# Patient Record
Sex: Female | Born: 1970 | Hispanic: Refuse to answer | Marital: Single | Smoking: Never smoker
Health system: Southern US, Community
[De-identification: ages and names within clinical notes are randomized; demographics above are authoritative.]

## PROBLEM LIST (undated history)

## (undated) DIAGNOSIS — E039 Hypothyroidism, unspecified: Secondary | ICD-10-CM

## (undated) DIAGNOSIS — E079 Disorder of thyroid, unspecified: Secondary | ICD-10-CM

## (undated) DIAGNOSIS — J45909 Unspecified asthma, uncomplicated: Secondary | ICD-10-CM

## (undated) DIAGNOSIS — F419 Anxiety disorder, unspecified: Secondary | ICD-10-CM

## (undated) DIAGNOSIS — R519 Headache, unspecified: Secondary | ICD-10-CM

## (undated) DIAGNOSIS — J189 Pneumonia, unspecified organism: Secondary | ICD-10-CM

## (undated) DIAGNOSIS — M1990 Unspecified osteoarthritis, unspecified site: Secondary | ICD-10-CM

## (undated) HISTORY — PX: STRABISMUS SURGERY: SHX218

## (undated) HISTORY — PX: TUBAL LIGATION: SHX77

## (undated) HISTORY — PX: TOE AMPUTATION: SHX809

---
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
## 1996-12-03 DIAGNOSIS — E063 Autoimmune thyroiditis: Secondary | ICD-10-CM

## 1996-12-03 HISTORY — DX: Autoimmune thyroiditis: E06.3

## 2018-03-17 ENCOUNTER — Other Ambulatory Visit: Payer: Self-pay

## 2018-03-17 ENCOUNTER — Encounter (HOSPITAL_COMMUNITY): Payer: Self-pay | Admitting: Emergency Medicine

## 2018-03-17 ENCOUNTER — Emergency Department (HOSPITAL_COMMUNITY)
Admission: EM | Admit: 2018-03-17 | Discharge: 2018-03-17 | Disposition: A | Discharge status: Home / Self Care | Payer: Medicare Other | Attending: Emergency Medicine | Admitting: Emergency Medicine

## 2018-03-17 ENCOUNTER — Emergency Department (HOSPITAL_COMMUNITY): Payer: Medicare Other

## 2018-03-17 DIAGNOSIS — E079 Disorder of thyroid, unspecified: Secondary | ICD-10-CM | POA: Diagnosis not present

## 2018-03-17 DIAGNOSIS — Y999 Unspecified external cause status: Secondary | ICD-10-CM | POA: Insufficient documentation

## 2018-03-17 DIAGNOSIS — W19XXXA Unspecified fall, initial encounter: Secondary | ICD-10-CM | POA: Insufficient documentation

## 2018-03-17 DIAGNOSIS — S0990XA Unspecified injury of head, initial encounter: Secondary | ICD-10-CM | POA: Diagnosis not present

## 2018-03-17 DIAGNOSIS — Y929 Unspecified place or not applicable: Secondary | ICD-10-CM | POA: Diagnosis not present

## 2018-03-17 DIAGNOSIS — Y939 Activity, unspecified: Secondary | ICD-10-CM | POA: Insufficient documentation

## 2018-03-17 DIAGNOSIS — M791 Myalgia, unspecified site: Secondary | ICD-10-CM | POA: Diagnosis not present

## 2018-03-17 DIAGNOSIS — M7918 Myalgia, other site: Secondary | ICD-10-CM

## 2018-03-17 HISTORY — DX: Disorder of thyroid, unspecified: E07.9

## 2018-03-17 MED ORDER — KETOROLAC TROMETHAMINE 30 MG/ML IJ SOLN
30.0000 mg | Freq: Once | INTRAMUSCULAR | Status: AC
  Administered 2018-03-17: 30 mg via INTRAMUSCULAR
  Filled 2018-03-17: qty 1

## 2018-03-17 MED ORDER — CARISOPRODOL 250 MG PO TABS: 350.0000 mg | ORAL_TABLET | Freq: Three times a day (TID) | ORAL | 0 refills | Status: DC

## 2018-03-17 NOTE — Discharge Instructions (Addendum)
Please read attached information. If you experience any new or worsening signs or symptoms please return to the emergency room for evaluation. Please follow-up with your primary care provider or specialist as discussed. Please use medication prescribed only as directed and discontinue taking if you have any concerning signs or symptoms.   °

## 2018-03-17 NOTE — ED Triage Notes (Signed)
Pt states she fell Saturday night into Sunday morning during a concert in [place], pt states still she has had a headache with nausea. Pt drove herself here from [place]. Pt is alert and oriented to person and place and time.

## 2018-03-17 NOTE — ED Notes (Signed)
Pt in xray

## 2018-03-17 NOTE — ED Provider Notes (Signed)
[facility] EMERGENCY DEPARTMENT Provider Note   CSN: [id number] Arrival date & time: 03/17/18  1004     History   Chief Complaint Chief Complaint  Patient presents with  . Fall    HPI Brenda Serrano is a 47 y.o. female.  HPI   47 year old female presents status post fall.  Patient notes she is visiting from out of town.  She was at a concert 2 days ago in the evening when she suffered a fall.  She cannot recall the events surrounding the fall.  She notes a hematoma to the back of the head, pain to the thoracic and lumbar region diffusely.  She notes intermittent tingling in her fingertips when laying in certain positions, she denies any cervical pain.  She reports some dizziness, lightheadedness and nausea.  Patient notes history of chronic low back pain that was managed by pain management in [place] where she lives.  Patient also reports bruising to her right elbow full active range of motion without significant pain.  Patient notes using ibuprofen and ice without significant improvement in her symptoms.  Patient reports she was drinking prior to the event reporting "one shot".    Past Medical History:  Diagnosis Date  . Thyroid disease     There are no active problems to display for this patient.   History reviewed. No pertinent surgical history.   OB History   None      Home Medications    Prior to Admission medications   Medication Sig Start Date End Date Taking? Authorizing Provider  carisoprodol (SOMA) 250 MG tablet Take 1.5 tablets (375 mg total) by mouth 3 (three) times daily. 03/17/18   Eyvonne Mechanic, PA-C    Family History No family history on file.  Social History Social History   Tobacco Use  . Smoking status: Not on file  Substance Use Topics  . Alcohol use: Not on file  . Drug use: Not on file     Allergies   Amoxicillin; Gabapentin; and Trazodone   Review of Systems Review of Systems  All other systems reviewed  and are negative.    Physical Exam Updated Vital Signs BP (!) 140/104 (BP Location: Left Arm)   Pulse 100   Temp 98.2 F (36.8 C) (Oral)   Resp 18   SpO2 100%   Physical Exam  Constitutional: She is oriented to person, place, and time. She appears well-developed and well-nourished.  HENT:  Head: Normocephalic.  Small hematoma to the posterior scalp no active bleeding or lacerations  Eyes: Pupils are equal, round, and reactive to light. Conjunctivae are normal. Right eye exhibits no discharge. Left eye exhibits no discharge. No scleral icterus.  Neck: Normal range of motion. No JVD present. No tracheal deviation present.  Pulmonary/Chest: Effort normal. No stridor.  Musculoskeletal:  No C-spine tenderness palpation full active pain-free range of motion of the neck-exquisite tenderness to palpation with even light palpation to the thoracic and lumbar regions, no bruising or obvious deformities-bilateral upper and lower extremity sensation strength and motor function intact-right elbow with bruising full active range of motion, no bony abnormalities  Neurological: She is alert and oriented to person, place, and time. She has normal strength. No cranial nerve deficit or sensory deficit. Coordination normal. GCS eye subscore is 4. GCS verbal subscore is 5. GCS motor subscore is 6.  Psychiatric: She has a normal mood and affect. Her behavior is normal. Judgment and thought content normal.  Nursing note and vitals reviewed.  ED Treatments / Results  Labs (all labs ordered are listed, but only abnormal results are displayed) Labs Reviewed - No data to display  EKG None  Radiology Dg Thoracic Spine 2 View  Result Date: 03/17/2018 CLINICAL DATA:  Pain following fall EXAM: THORACIC SPINE 3 VIEWS COMPARISON:  None. FINDINGS: Frontal, lateral, and swimmer's views were obtained. There is lower thoracic dextroscoliosis. There is no fracture or spondylolisthesis. The disc spaces appear  normal. No erosive change. IMPRESSION: Scoliosis. No fracture or spondylolisthesis. No appreciable arthropathy. Electronically Signed   By: Bretta Bang III M.D.   On: 03/17/2018 13:07   Dg Lumbar Spine Complete  Result Date: 03/17/2018 CLINICAL DATA:  Pain following fall EXAM: LUMBAR SPINE - COMPLETE 4+ VIEW COMPARISON:  None. FINDINGS: Frontal, lateral, spot lumbosacral lateral, and bilateral oblique views were obtained. There are 5 non-rib-bearing lumbar type vertebral bodies. There is lumbar levoscoliosis. There is no fracture or spondylolisthesis. Disc spaces appear normal. There is no appreciable facet arthropathy. Intrauterine device is positioned in the mid-pelvis. IMPRESSION: Scoliosis. No fracture or spondylolisthesis. No appreciable arthropathy. Intrauterine device in mid pelvis. Electronically Signed   By: Bretta Bang III M.D.   On: 03/17/2018 13:06   Ct Head Wo Contrast  Result Date: 03/17/2018 CLINICAL DATA:  Slipped and fell striking posterior head. EXAM: CT HEAD WITHOUT CONTRAST TECHNIQUE: Contiguous axial images were obtained from the base of the skull through the vertex without intravenous contrast. COMPARISON:  None. FINDINGS: Brain: The brain has normal appearance without evidence of atrophy, old or acute infarction, mass lesion, hemorrhage, hydrocephalus or extra-axial collection. Vascular: No abnormal vascular finding. Skull: No skull fracture. Sinuses/Orbits: Clear/normal Other: Posterior scalp swelling. IMPRESSION: Posterior scalp swelling. No underlying skull fracture. No abnormal intracranial finding. Electronically Signed   By: Paulina Fusi M.D.   On: 03/17/2018 12:01    Procedures Procedures (including critical care time)  Medications Ordered in ED Medications  ketorolac (TORADOL) 30 MG/ML injection 30 mg (30 mg Intramuscular Given 03/17/18 1310)     Initial Impression / Assessment and Plan / ED Course  I have reviewed the triage vital signs and the nursing  notes.  Pertinent labs & imaging results that were available during my care of the patient were reviewed by me and considered in my medical decision making (see chart for details).     Final Clinical Impressions(s) / ED Diagnoses   Final diagnoses:  Injury of head, initial encounter  Musculoskeletal pain   Labs:   Imaging: DG thoracic spine, DG lumbar spine, CT head without  Consults:  Therapeutics:  Discharge Meds: Soma  Assessment/Plan: 47 year old female presents today status post fall.  Patient reports amnesia surrounding the event is very acute alert and oriented at this time.  Patient has exquisite tenderness to even light touch to her back diffusely, this is not consistent with her physical exam without signs of trauma and negative plain films.  Patient has no acute neurological deficits here, no significant findings on imaging study.  She is requesting Tresa Garter as she has had good relief with this medication in the past for musculoskeletal complaints.  She was given a short prescription for this I encouraged her to follow-up as an outpatient with neurology, she is given strict return precautions, she verbalized understanding and agreement to today's plan had no further questions or concerns at the time of discharge.      ED Discharge Orders        Ordered    carisoprodol (SOMA) 250 MG  tablet  3 times daily     03/17/18 1318       Eyvonne MechanicHedges, Uzziel Russey, Cordelia Poche-C 03/17/18 1322    Arby BarrettePfeiffer, Marcy, MD 03/17/18 [phone]

## 2019-02-04 ENCOUNTER — Other Ambulatory Visit: Payer: Self-pay | Admitting: Orthopedic Surgery

## 2019-02-04 DIAGNOSIS — S63649A Sprain of metacarpophalangeal joint of unspecified thumb, initial encounter: Secondary | ICD-10-CM

## 2019-02-16 ENCOUNTER — Other Ambulatory Visit: Payer: Self-pay

## 2019-02-16 ENCOUNTER — Ambulatory Visit
Admission: RE | Admit: 2019-02-16 | Discharge: 2019-02-16 | Disposition: A | Discharge status: Home / Self Care | Payer: Medicare Other | Source: Ambulatory Visit | Attending: Orthopedic Surgery | Admitting: Orthopedic Surgery

## 2019-02-16 DIAGNOSIS — S63649A Sprain of metacarpophalangeal joint of unspecified thumb, initial encounter: Secondary | ICD-10-CM

## 2019-02-16 MED ORDER — IOPAMIDOL (ISOVUE-M 200) INJECTION 41%
0.5000 mL | Freq: Once | INTRAMUSCULAR | Status: AC
  Administered 2019-02-16: 0.5 mL via INTRA_ARTICULAR

## 2019-02-17 ENCOUNTER — Other Ambulatory Visit: Payer: Self-pay | Admitting: Orthopedic Surgery

## 2019-02-17 DIAGNOSIS — S63641S Sprain of metacarpophalangeal joint of right thumb, sequela: Secondary | ICD-10-CM

## 2019-02-17 DIAGNOSIS — S5331XS Traumatic rupture of right ulnar collateral ligament, sequela: Secondary | ICD-10-CM

## 2019-02-17 DIAGNOSIS — M79644 Pain in right finger(s): Secondary | ICD-10-CM

## 2019-02-20 ENCOUNTER — Other Ambulatory Visit: Payer: Self-pay | Admitting: Orthopedic Surgery

## 2019-02-20 DIAGNOSIS — S63641S Sprain of metacarpophalangeal joint of right thumb, sequela: Secondary | ICD-10-CM

## 2019-02-20 DIAGNOSIS — S5331XS Traumatic rupture of right ulnar collateral ligament, sequela: Secondary | ICD-10-CM

## 2019-03-24 ENCOUNTER — Other Ambulatory Visit: Payer: TRICARE For Life (TFL)

## 2019-05-15 ENCOUNTER — Ambulatory Visit
Admission: RE | Admit: 2019-05-15 | Discharge: 2019-05-15 | Disposition: A | Discharge status: Home / Self Care | Payer: TRICARE For Life (TFL) | Source: Ambulatory Visit | Attending: Orthopedic Surgery | Admitting: Orthopedic Surgery

## 2019-05-15 ENCOUNTER — Ambulatory Visit
Admission: RE | Admit: 2019-05-15 | Discharge: 2019-05-15 | Disposition: A | Discharge status: Home / Self Care | Payer: Self-pay | Source: Ambulatory Visit | Attending: Orthopedic Surgery | Admitting: Orthopedic Surgery

## 2019-05-15 DIAGNOSIS — S5331XS Traumatic rupture of right ulnar collateral ligament, sequela: Secondary | ICD-10-CM

## 2019-05-15 DIAGNOSIS — S63641S Sprain of metacarpophalangeal joint of right thumb, sequela: Secondary | ICD-10-CM

## 2019-05-15 MED ORDER — IOPAMIDOL (ISOVUE-M 200) INJECTION 41%
0.5000 mL | Freq: Once | INTRAMUSCULAR | Status: AC
  Administered 2019-05-15: 0.5 mL via INTRA_ARTICULAR

## 2019-06-03 ENCOUNTER — Other Ambulatory Visit: Payer: Self-pay | Admitting: Orthopedic Surgery

## 2019-06-23 ENCOUNTER — Other Ambulatory Visit: Payer: Self-pay

## 2019-06-23 ENCOUNTER — Encounter (HOSPITAL_BASED_OUTPATIENT_CLINIC_OR_DEPARTMENT_OTHER): Payer: Self-pay | Admitting: *Deleted

## 2019-06-26 ENCOUNTER — Other Ambulatory Visit: Payer: Self-pay

## 2019-06-26 ENCOUNTER — Other Ambulatory Visit (HOSPITAL_COMMUNITY)
Admission: RE | Admit: 2019-06-26 | Discharge: 2019-06-26 | Disposition: A | Discharge status: Home / Self Care | Payer: Medicare Other | Source: Ambulatory Visit | Attending: Orthopedic Surgery | Admitting: Orthopedic Surgery

## 2019-06-26 DIAGNOSIS — Z1159 Encounter for screening for other viral diseases: Secondary | ICD-10-CM | POA: Diagnosis present

## 2019-06-27 LAB — SARS CORONAVIRUS 2 (TAT 6-24 HRS): SARS Coronavirus 2: NEGATIVE

## 2019-06-30 ENCOUNTER — Encounter (HOSPITAL_BASED_OUTPATIENT_CLINIC_OR_DEPARTMENT_OTHER): Payer: Self-pay

## 2019-06-30 ENCOUNTER — Ambulatory Visit (HOSPITAL_BASED_OUTPATIENT_CLINIC_OR_DEPARTMENT_OTHER)
Admission: RE | Admit: 2019-06-30 | Discharge: 2019-06-30 | Disposition: A | Discharge status: Home / Self Care | Payer: Medicare Other | Attending: Orthopedic Surgery | Admitting: Orthopedic Surgery

## 2019-06-30 ENCOUNTER — Other Ambulatory Visit: Payer: Self-pay

## 2019-06-30 ENCOUNTER — Ambulatory Visit (HOSPITAL_BASED_OUTPATIENT_CLINIC_OR_DEPARTMENT_OTHER): Payer: Medicare Other | Admitting: Anesthesiology

## 2019-06-30 ENCOUNTER — Encounter (HOSPITAL_BASED_OUTPATIENT_CLINIC_OR_DEPARTMENT_OTHER)
Admission: RE | Disposition: A | Discharge status: Home / Self Care | Payer: Self-pay | Source: Home / Self Care | Attending: Orthopedic Surgery

## 2019-06-30 DIAGNOSIS — S63418A Traumatic rupture of collateral ligament of other finger at metacarpophalangeal and interphalangeal joint, initial encounter: Secondary | ICD-10-CM | POA: Insufficient documentation

## 2019-06-30 DIAGNOSIS — Z888 Allergy status to other drugs, medicaments and biological substances status: Secondary | ICD-10-CM | POA: Insufficient documentation

## 2019-06-30 DIAGNOSIS — X58XXXA Exposure to other specified factors, initial encounter: Secondary | ICD-10-CM | POA: Diagnosis not present

## 2019-06-30 DIAGNOSIS — Z880 Allergy status to penicillin: Secondary | ICD-10-CM | POA: Diagnosis not present

## 2019-06-30 DIAGNOSIS — Z89421 Acquired absence of other right toe(s): Secondary | ICD-10-CM | POA: Insufficient documentation

## 2019-06-30 DIAGNOSIS — J45909 Unspecified asthma, uncomplicated: Secondary | ICD-10-CM | POA: Insufficient documentation

## 2019-06-30 DIAGNOSIS — F419 Anxiety disorder, unspecified: Secondary | ICD-10-CM | POA: Diagnosis not present

## 2019-06-30 DIAGNOSIS — E039 Hypothyroidism, unspecified: Secondary | ICD-10-CM | POA: Diagnosis not present

## 2019-06-30 DIAGNOSIS — G5603 Carpal tunnel syndrome, bilateral upper limbs: Secondary | ICD-10-CM | POA: Insufficient documentation

## 2019-06-30 DIAGNOSIS — M1990 Unspecified osteoarthritis, unspecified site: Secondary | ICD-10-CM | POA: Insufficient documentation

## 2019-06-30 HISTORY — PX: CARPAL TUNNEL RELEASE: SHX101

## 2019-06-30 HISTORY — DX: Hypothyroidism, unspecified: E03.9

## 2019-06-30 HISTORY — DX: Anxiety disorder, unspecified: F41.9

## 2019-06-30 HISTORY — PX: ULNAR COLLATERAL LIGAMENT REPAIR: SHX6159

## 2019-06-30 HISTORY — DX: Unspecified asthma, uncomplicated: J45.909

## 2019-06-30 SURGERY — REPAIR, LIGAMENT, ULNAR COLLATERAL
Anesthesia: General | Site: Wrist | Laterality: Right

## 2019-06-30 MED ORDER — LIDOCAINE 2% (20 MG/ML) 5 ML SYRINGE
INTRAMUSCULAR | Status: AC
  Filled 2019-06-30: qty 5

## 2019-06-30 MED ORDER — PROMETHAZINE HCL 25 MG/ML IJ SOLN
INTRAMUSCULAR | Status: AC
  Filled 2019-06-30: qty 1

## 2019-06-30 MED ORDER — ONDANSETRON HCL 4 MG/2ML IJ SOLN
INTRAMUSCULAR | Status: AC
  Filled 2019-06-30: qty 2

## 2019-06-30 MED ORDER — MIDAZOLAM HCL 2 MG/2ML IJ SOLN
INTRAMUSCULAR | Status: AC
  Filled 2019-06-30: qty 2

## 2019-06-30 MED ORDER — DEXAMETHASONE SODIUM PHOSPHATE 10 MG/ML IJ SOLN
INTRAMUSCULAR | Status: AC
  Filled 2019-06-30: qty 1

## 2019-06-30 MED ORDER — CHLORHEXIDINE GLUCONATE 4 % EX LIQD: 60.0000 mL | Freq: Once | CUTANEOUS | Status: DC

## 2019-06-30 MED ORDER — FENTANYL CITRATE (PF) 100 MCG/2ML IJ SOLN
25.0000 ug | INTRAMUSCULAR | Status: DC | PRN
  Administered 2019-06-30: 50 ug via INTRAVENOUS

## 2019-06-30 MED ORDER — ROPIVACAINE HCL 5 MG/ML IJ SOLN
INTRAMUSCULAR | Status: DC | PRN
  Administered 2019-06-30: 30 mL via PERINEURAL

## 2019-06-30 MED ORDER — VANCOMYCIN HCL IN DEXTROSE 1-5 GM/200ML-% IV SOLN
INTRAVENOUS | Status: AC
  Filled 2019-06-30: qty 200

## 2019-06-30 MED ORDER — SCOPOLAMINE 1 MG/3DAYS TD PT72: 1.0000 | MEDICATED_PATCH | Freq: Once | TRANSDERMAL | Status: DC

## 2019-06-30 MED ORDER — FENTANYL CITRATE (PF) 100 MCG/2ML IJ SOLN
INTRAMUSCULAR | Status: AC
  Filled 2019-06-30: qty 2

## 2019-06-30 MED ORDER — LIDOCAINE HCL (CARDIAC) PF 100 MG/5ML IV SOSY
PREFILLED_SYRINGE | INTRAVENOUS | Status: DC | PRN
  Administered 2019-06-30: 60 mg via INTRAVENOUS

## 2019-06-30 MED ORDER — PROPOFOL 10 MG/ML IV BOLUS
INTRAVENOUS | Status: DC | PRN
  Administered 2019-06-30: 150 mg via INTRAVENOUS

## 2019-06-30 MED ORDER — SODIUM CHLORIDE (PF) 0.9 % IJ SOLN
INTRAMUSCULAR | Status: AC
  Filled 2019-06-30: qty 10

## 2019-06-30 MED ORDER — HYDROCODONE-ACETAMINOPHEN 5-325 MG PO TABS: 1.0000 | ORAL_TABLET | Freq: Four times a day (QID) | ORAL | 0 refills | Status: DC | PRN

## 2019-06-30 MED ORDER — ONDANSETRON HCL 4 MG/2ML IJ SOLN
INTRAMUSCULAR | Status: DC | PRN
  Administered 2019-06-30: 4 mg via INTRAVENOUS

## 2019-06-30 MED ORDER — LACTATED RINGERS IV SOLN
INTRAVENOUS | Status: DC
  Administered 2019-06-30 (×2): via INTRAVENOUS

## 2019-06-30 MED ORDER — KETOROLAC TROMETHAMINE 30 MG/ML IJ SOLN: 30.0000 mg | Freq: Once | INTRAMUSCULAR | Status: DC | PRN

## 2019-06-30 MED ORDER — FENTANYL CITRATE (PF) 100 MCG/2ML IJ SOLN
50.0000 ug | INTRAMUSCULAR | Status: DC | PRN
  Administered 2019-06-30: 50 ug via INTRAVENOUS
  Administered 2019-06-30: 100 ug via INTRAVENOUS

## 2019-06-30 MED ORDER — PROMETHAZINE HCL 25 MG/ML IJ SOLN
6.2500 mg | INTRAMUSCULAR | Status: DC | PRN
  Administered 2019-06-30: 6.25 mg via INTRAVENOUS

## 2019-06-30 MED ORDER — BUPIVACAINE HCL (PF) 0.25 % IJ SOLN
INTRAMUSCULAR | Status: DC | PRN
  Administered 2019-06-30: 8 mL

## 2019-06-30 MED ORDER — VANCOMYCIN HCL IN DEXTROSE 1-5 GM/200ML-% IV SOLN
1000.0000 mg | INTRAVENOUS | Status: AC
  Administered 2019-06-30: 1000 mg via INTRAVENOUS

## 2019-06-30 MED ORDER — MIDAZOLAM HCL 2 MG/2ML IJ SOLN
1.0000 mg | INTRAMUSCULAR | Status: DC | PRN
  Administered 2019-06-30 (×2): 2 mg via INTRAVENOUS

## 2019-06-30 MED ORDER — CLONIDINE HCL (ANALGESIA) 100 MCG/ML EP SOLN
EPIDURAL | Status: DC | PRN
  Administered 2019-06-30: 100 ug

## 2019-06-30 MED ORDER — VANCOMYCIN HCL 1000 MG IV SOLR
INTRAVENOUS | Status: DC | PRN
  Administered 2019-06-30: 1000 mg via INTRAVENOUS

## 2019-06-30 MED ORDER — DEXAMETHASONE SODIUM PHOSPHATE 4 MG/ML IJ SOLN
INTRAMUSCULAR | Status: DC | PRN
  Administered 2019-06-30: 10 mg via INTRAVENOUS

## 2019-06-30 SURGICAL SUPPLY — 74 items
ANCHOR JUGGERKNOT 1.0 1DR 2-0 (Anchor) ×1 IMPLANT
BAG DECANTER FOR FLEXI CONT (MISCELLANEOUS) IMPLANT
BLADE MINI RND TIP GREEN BEAV (BLADE) ×1 IMPLANT
BLADE SURG 15 STRL LF DISP TIS (BLADE) ×2 IMPLANT
BLADE SURG 15 STRL SS (BLADE) ×1
BNDG COHESIVE 3X5 TAN STRL LF (GAUZE/BANDAGES/DRESSINGS) ×3 IMPLANT
BNDG ESMARK 4X9 LF (GAUZE/BANDAGES/DRESSINGS) ×1 IMPLANT
BNDG GAUZE ELAST 4 BULKY (GAUZE/BANDAGES/DRESSINGS) ×3 IMPLANT
CHLORAPREP W/TINT 26 (MISCELLANEOUS) ×3 IMPLANT
CORD BIPOLAR FORCEPS 12FT (ELECTRODE) ×3 IMPLANT
COVER BACK TABLE REUSABLE LG (DRAPES) ×3 IMPLANT
COVER MAYO STAND REUSABLE (DRAPES) ×3 IMPLANT
COVER WAND RF STERILE (DRAPES) IMPLANT
CUFF TOURN SGL QUICK 18X4 (TOURNIQUET CUFF) ×3 IMPLANT
DECANTER SPIKE VIAL GLASS SM (MISCELLANEOUS) ×2 IMPLANT
DRAPE EXTREMITY T 121X128X90 (DISPOSABLE) ×3 IMPLANT
DRAPE OEC MINIVIEW 54X84 (DRAPES) IMPLANT
DRAPE SURG 17X23 STRL (DRAPES) ×3 IMPLANT
DRSG PAD ABDOMINAL 8X10 ST (GAUZE/BANDAGES/DRESSINGS) ×3 IMPLANT
GAUZE SPONGE 4X4 12PLY STRL (GAUZE/BANDAGES/DRESSINGS) ×3 IMPLANT
GAUZE XEROFORM 1X8 LF (GAUZE/BANDAGES/DRESSINGS) ×3 IMPLANT
GLOVE BIO SURGEON STRL SZ7.5 (GLOVE) ×1 IMPLANT
GLOVE BIOGEL PI IND STRL 7.0 (GLOVE) IMPLANT
GLOVE BIOGEL PI IND STRL 8 (GLOVE) IMPLANT
GLOVE BIOGEL PI IND STRL 8.5 (GLOVE) ×2 IMPLANT
GLOVE BIOGEL PI INDICATOR 7.0 (GLOVE) ×1
GLOVE BIOGEL PI INDICATOR 8 (GLOVE) ×1
GLOVE BIOGEL PI INDICATOR 8.5 (GLOVE) ×1
GLOVE ECLIPSE 6.5 STRL STRAW (GLOVE) ×2 IMPLANT
GLOVE SURG ORTHO 8.0 STRL STRW (GLOVE) ×3 IMPLANT
GOWN STRL REUS W/ TWL LRG LVL3 (GOWN DISPOSABLE) ×2 IMPLANT
GOWN STRL REUS W/TWL LRG LVL3 (GOWN DISPOSABLE) ×1
GOWN STRL REUS W/TWL XL LVL3 (GOWN DISPOSABLE) ×4 IMPLANT
K-WIRE .035X4 (WIRE) IMPLANT
NDL KEITH (NEEDLE) IMPLANT
NDL KEITH SZ10 STRAIGHT (NEEDLE) IMPLANT
NDL PRECISIONGLIDE 27X1.5 (NEEDLE) IMPLANT
NDL SAFETY ECLIPSE 18X1.5 (NEEDLE) IMPLANT
NEEDLE FISTULA 1/2 CIRCLE (NEEDLE) IMPLANT
NEEDLE HYPO 18GX1.5 SHARP (NEEDLE)
NEEDLE KEITH (NEEDLE) IMPLANT
NEEDLE KEITH SZ10 STRAIGHT (NEEDLE) IMPLANT
NEEDLE PRECISIONGLIDE 27X1.5 (NEEDLE) ×3 IMPLANT
NS IRRIG 1000ML POUR BTL (IV SOLUTION) ×3 IMPLANT
PACK BASIN DAY SURGERY FS (CUSTOM PROCEDURE TRAY) ×3 IMPLANT
PAD CAST 3X4 CTTN HI CHSV (CAST SUPPLIES) ×2 IMPLANT
PAD CAST 4YDX4 CTTN HI CHSV (CAST SUPPLIES) IMPLANT
PADDING CAST ABS 4INX4YD NS (CAST SUPPLIES)
PADDING CAST ABS COTTON 4X4 ST (CAST SUPPLIES) ×2 IMPLANT
PADDING CAST COTTON 3X4 STRL (CAST SUPPLIES) ×1
PADDING CAST COTTON 4X4 STRL (CAST SUPPLIES)
PASSER SUT SWANSON 36MM LOOP (INSTRUMENTS) IMPLANT
SLEEVE SCD COMPRESS KNEE MED (MISCELLANEOUS) ×1 IMPLANT
SLING ARM FOAM STRAP LRG (SOFTGOODS) ×1 IMPLANT
SPLINT PLASTER CAST XFAST 3X15 (CAST SUPPLIES) IMPLANT
SPLINT PLASTER XTRA FASTSET 3X (CAST SUPPLIES) ×10
STOCKINETTE 4X48 STRL (DRAPES) ×3 IMPLANT
SUT ETHIBOND 3-0 V-5 (SUTURE) IMPLANT
SUT ETHILON 4 0 PS 2 18 (SUTURE) ×3 IMPLANT
SUT FIBERWIRE 2-0 18 17.9 3/8 (SUTURE)
SUT FIBERWIRE 3-0 18 TAPR NDL (SUTURE)
SUT MERSILENE 2.0 SH NDLE (SUTURE) IMPLANT
SUT MERSILENE 4 0 P 3 (SUTURE) IMPLANT
SUT MERSILENE 5 0 P 3 (SUTURE) ×1 IMPLANT
SUT SILK 4 0 PS 2 (SUTURE) IMPLANT
SUT STEEL 3 0 (SUTURE) IMPLANT
SUT STEEL 4 0 (SUTURE) IMPLANT
SUT VICRYL 4-0 PS2 18IN ABS (SUTURE) IMPLANT
SUTURE FIBERWR 2-0 18 17.9 3/8 (SUTURE) IMPLANT
SUTURE FIBERWR 3-0 18 TAPR NDL (SUTURE) IMPLANT
SYR BULB 3OZ (MISCELLANEOUS) ×3 IMPLANT
SYR CONTROL 10ML LL (SYRINGE) ×1 IMPLANT
TOWEL GREEN STERILE FF (TOWEL DISPOSABLE) ×5 IMPLANT
UNDERPAD 30X30 (UNDERPADS AND DIAPERS) ×2 IMPLANT

## 2019-06-30 NOTE — Anesthesia Procedure Notes (Signed)
Procedure Name: LMA Insertion Date/Time: 06/30/2019 1:20 PM Performed by: Lyndee Leo, CRNA Pre-anesthesia Checklist: Patient identified, Emergency Drugs available, Suction available and Patient being monitored Patient Re-evaluated:Patient Re-evaluated prior to induction Oxygen Delivery Method: Circle system utilized Preoxygenation: Pre-oxygenation with 100% oxygen Induction Type: IV induction Ventilation: Mask ventilation without difficulty LMA: LMA inserted LMA Size: 4.0 Number of attempts: 1 Airway Equipment and Method: Bite block Placement Confirmation: positive ETCO2 Tube secured with: Tape Dental Injury: Teeth and Oropharynx as per pre-operative assessment

## 2019-06-30 NOTE — Anesthesia Postprocedure Evaluation (Signed)
Anesthesia Post Note  Patient: Brenda Serrano  Procedure(s) Performed: REPAIR RECONSTRUCTION ULNAR COLLATERAL LIGAMENT RIGHT THUMB (Right Thumb) CARPAL TUNNEL RELEASE (Right Wrist)     Patient location during evaluation: PACU Anesthesia Type: General Level of consciousness: awake and alert Pain management: pain level controlled Vital Signs Assessment: post-procedure vital signs reviewed and stable Respiratory status: spontaneous breathing, nonlabored ventilation, respiratory function stable and patient connected to nasal cannula oxygen Cardiovascular status: blood pressure returned to baseline and stable Postop Assessment: no apparent nausea or vomiting Anesthetic complications: no    Last Vitals:  Vitals:   06/30/19 1430 06/30/19 1445  BP: 124/89 125/87  Pulse: 64 63  Resp: 16 14  Temp:    SpO2: 99% 97%    Last Pain:  Vitals:   06/30/19 1445  TempSrc:   PainSc: 0-No pain                 Treyven Lafauci S

## 2019-06-30 NOTE — Op Note (Signed)
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   ASSISTANT: Leanora Cover, MD   ANESTHESIA:  General with regional and Local   INTRAVENOUS FLUIDS:  Per anesthesia flow sheet.   ESTIMATED BLOOD LOSS:  Minimal.   COMPLICATIONS:  None.   SPECIMENS:  none   TOURNIQUET TIME:    Total Tourniquet Time Documented: Upper Arm (Right) - 35 minutes Total: Upper Arm (Right) - 35 minutes    DISPOSITION:  Stable to PACU.   INDICATIONS: Patient is a 48 year old female with a history of numbness and tingling injury to the metacarpal phalangeal joint of her right thumb nerve conductions are positive for carpal tunnel syndrome bilaterally done by Dr. Brien Few.  MRI is positive for a rupture of the ulnar collateral ligament of her thumb which has not healed despite conservative treatment.  She has elected to undergo decompression of the nerve and repair of the collateral ligament.  Pre-peri-and postoperative course been discussed along with risks and complications.  She is aware that there is no guarantee to the surgery the possibility of infection recurrence injury to arteries nerves tendons complete relief symptoms and dystrophy.  In the preoperative area the patient is seen the extremity marked by both patient and surgeon antibiotic given  OPERATIVE COURSE: Patient is brought to the operating room after supraclavicular block was carried out without difficulty in the preoperative area under the direction the anesthesia department.  Unfortunately she had  good function of her arm with sensation and motor function.  A general anesthetic was then given by the anesthesia department.  She was prepped and draped in supine position with the right arm free.  Prep was done with ChloraPrep 3-minute dry time allowed and a timeout taken confirming patient procedure.  The limb was exsanguinated there is an Esmarch bandage turn placed high in the arm was inflated to 250 mmHg.  A longitudinal incision was made in the right palm carried down through subcutaneous tissue.  Bleeders were electrocauterized with bipolar.  The palmar fascia was split.  The superficial palmar arch was identified.  The flexor tendon the ring little finger at is identified.  The flexor tendons median nerve were then retracted radially and the ulnar nerve ulnarly.  The flexor retinaculum was then released on its ulnar border.  A right angle and stool retractor placed between skin and forearm fascia deep structures were dissected free with blunt dissection and release and performed in line with the ring finger.  This was done approximately 2 to 3 cm proximal to the wrist wrist crease under direct vision.  The canal was explored.  An area compression of the nerve was apparent.  No further lesions were identified.  Motor branch entered the muscle distally.  The wound was irrigated and closed with interrupted 4-0 nylon sutures.  The collateral ligament was attended to next.  A curvilinear incision was made over the ulnar aspect of the metacarpal phalangeal joint of the right thumb.  This carried down through subcutaneous tissue.  Bleeders were again electrocauterized bipolar.  The dorsal sensory ulnar branch  of the nerve was identified.  The abductor aponeurosis was identified dissected free from the underlying scar.  This was then transected on the ulnar border of the EPL tendon.  The joint was then opened on the dorsal aspect.  The rupture of the collateral ligament was apparent from the proximal phalanx.   This was freed of any scar.  The bone roughened and a drill hole made for placement of a 1 mm juggernaut's anchor.  The anchor was inserted after irrigation the collateral ligament was then sutured to the bone.  The suture was taken distally sutured through the periosteum and then brought back onto the dorsal aspect of the collateral ligament and tied into stages.  This firmly stabilize the joint but full extension flexion.  The wound was again irrigated the adductor aponeurosis was repaired with a running 5-0 Mersilene suture.  The skin was repaired with interrupted 4-0 nylon sutures.  A sterile compressive dressing thumb spica splint was applied.  This was done after a local infiltration quarter percent bupivacaine to each of the wounds total of 9 cc was used.  A splint was applied and the patient taken to the recovery room for observation in satisfactory condition after deflation of the tourniquet was performed and all fingers were noted to pink.  [facility] be [facility].  Try Tylenol ibuprofen first.   Cindee SaltGary Sircharles Holzheimer, MD Electronically signed, 06/30/19

## 2019-06-30 NOTE — Brief Op Note (Signed)
06/30/2019  2:07 PM  PATIENT:  Brenda Serrano  48 y.o. female  PRE-OPERATIVE DIAGNOSIS:  GAME KEEPERS THUMB RIGHT CARPAL TUNNEL SYNDROME  POST-OPERATIVE DIAGNOSIS:  GAME KEEPERS THUMB RIGHT CARPAL TUNNEL SYNDROME  PROCEDURE:  Procedure(s) with comments: REPAIR RECONSTRUCTION ULNAR COLLATERAL LIGAMENT RIGHT THUMB, POSSIBLE ABDUCTOR POLLICIS LONGUS (Right) - AXILLARY BLOCK CARPAL TUNNEL RELEASE (Right)  SURGEON:  Surgeon(s) and Role:    * Daryll Brod, MD - Primary    * Leanora Cover, MD - Assisting  PHYSICIAN ASSISTANT:   ASSISTANTS: K Evin Loiseau,MD    ANESTHESIA:   local, regional and general  EBL:32ml  BLOOD ADMINISTERED:none  DRAINS: none   LOCAL MEDICATIONS USED:  BUPIVICAINE   SPECIMEN:  No Specimen  DISPOSITION OF SPECIMEN:  N/A  COUNTS:  YES  TOURNIQUET:   Total Tourniquet Time Documented: Upper Arm (Right) - 35 minutes Total: Upper Arm (Right) - 35 minutes   DICTATION: .Viviann Spare Dictation  PLAN OF CARE: Discharge to home after PACU  PATIENT DISPOSITION:  PACU - hemodynamically stable.

## 2019-06-30 NOTE — Transfer of Care (Signed)
Immediate Anesthesia Transfer of Care Note  Patient: Brenda Serrano  Procedure(s) Performed: REPAIR RECONSTRUCTION ULNAR COLLATERAL LIGAMENT RIGHT THUMB, POSSIBLE ABDUCTOR POLLICIS LONGUS (Right Thumb) CARPAL TUNNEL RELEASE (Right Wrist)  Patient Location: PACU  Anesthesia Type:General  Level of Consciousness: awake, sedated and patient cooperative  Airway & Oxygen Therapy: Patient Spontanous Breathing and Patient connected to nasal cannula oxygen  Post-op Assessment: Report given to RN and Post -op Vital signs reviewed and stable  Post vital signs: Reviewed and stable  Last Vitals:  Vitals Value Taken Time  BP 117/91 06/30/19 1413  Temp    Pulse 84 06/30/19 1415  Resp 11 06/30/19 1415  SpO2 97 % 06/30/19 1415  Vitals shown include unvalidated device data.  Last Pain:  Vitals:   06/30/19 1137  TempSrc: Oral  PainSc: 0-No pain         Complications: No apparent anesthesia complications

## 2019-06-30 NOTE — Progress Notes (Signed)
Assisted Dr. Rose with right, ultrasound guided, axillary block. Side rails up, monitors on throughout procedure. See vital signs in flow sheet. Tolerated Procedure well. °

## 2019-06-30 NOTE — Anesthesia Procedure Notes (Signed)
Anesthesia Regional Block: Axillary brachial plexus block   Pre-Anesthetic Checklist: ,, timeout performed, Correct Patient, Correct Site, Correct Laterality, Correct Procedure, Correct Position, site marked, Risks and benefits discussed,  Surgical consent,  Pre-op evaluation,  At surgeon's request and post-op pain management  Laterality: Right  Prep: chloraprep       Needles:  Injection technique: Single-shot  Needle Type: Stimiplex     Needle Length: 5cm  Needle Gauge: 22     Additional Needles:   Procedures:, nerve stimulator,,,,,,,   Nerve Stimulator or Paresthesia:  Response: 0.4 mA,   Additional Responses:   Narrative:  Start time: 06/30/2019 12:00 PM End time: 06/30/2019 12:10 PM Injection made incrementally with aspirations every 5 mL.  Performed by: Personally  Anesthesiologist: Myrtie Soman, MD  Additional Notes: Patient tolerated the procedure well without complications

## 2019-06-30 NOTE — Op Note (Signed)
I assisted Surgeon(s) and Role:    * Daryll Brod, MD - Primary    * Leanora Cover, MD - Assisting on the Procedure(s): [place], POSSIBLE ABDUCTOR POLLICIS [place] on 06/30/2019.  I provided assistance on this case as follows: retraction soft tissues, positioning thumb.  Electronically signed by: Leanora Cover, MD Date: 06/30/2019 Time: 2:11 PM

## 2019-06-30 NOTE — Anesthesia Preprocedure Evaluation (Signed)
Anesthesia Evaluation  Patient identified by MRN, date of birth, ID band Patient awake    Reviewed: Allergy & Precautions, NPO status , Patient's Chart, lab work & pertinent test results  Airway Mallampati: II  TM Distance: >3 FB Neck ROM: Full    Dental no notable dental hx.    Pulmonary asthma ,    Pulmonary exam normal breath sounds clear to auscultation       Cardiovascular negative cardio ROS Normal cardiovascular exam Rhythm:Regular Rate:Normal     Neuro/Psych negative neurological ROS  negative psych ROS   GI/Hepatic negative GI ROS, Neg liver ROS,   Endo/Other  Hypothyroidism   Renal/GU negative Renal ROS  negative genitourinary   Musculoskeletal negative musculoskeletal ROS (+)   Abdominal   Peds negative pediatric ROS (+)  Hematology negative hematology ROS (+)   Anesthesia Other Findings   Reproductive/Obstetrics negative OB ROS                             Anesthesia Physical Anesthesia Plan  ASA: II  Anesthesia Plan: General   Post-op Pain Management:  Regional for Post-op pain   Induction: Intravenous  PONV Risk Score and Plan: 3 and Ondansetron, Dexamethasone and Treatment may vary due to age or medical condition  Airway Management Planned: LMA  Additional Equipment:   Intra-op Plan:   Post-operative Plan: Extubation in OR  Informed Consent: I have reviewed the patients History and Physical, chart, labs and discussed the procedure including the risks, benefits and alternatives for the proposed anesthesia with the patient or authorized representative who has indicated his/her understanding and acceptance.     Dental advisory given  Plan Discussed with: CRNA and Surgeon  Anesthesia Plan Comments:         Anesthesia Quick Evaluation

## 2019-06-30 NOTE — Discharge Instructions (Signed)
° °  ° ° ° °Hand Center Instructions °Hand Surgery ° °Wound Care: °Keep your hand elevated above the level of your heart.  Do not allow it to dangle by your side.  Keep the dressing dry and do not remove it unless your doctor advises you to do so.  He will usually change it at the time of your post-op visit.  Moving your fingers is advised to stimulate circulation but will depend on the site of your surgery.  If you have a splint applied, your doctor will advise you regarding movement. ° °Activity: °Do not drive or operate machinery today.  Rest today and then you may return to your normal activity and work as indicated by your physician. ° °Diet:  °Drink liquids today or eat a light diet.  You may resume a regular diet tomorrow.   ° °General expectations: °Pain for two to three days. °Fingers may become slightly swollen. ° °Call your doctor if any of the following occur: °Severe pain not relieved by pain medication. °Elevated temperature. °Dressing soaked with blood. °Inability to move fingers. °White or bluish color to fingers. ° ° °Regional Anesthesia Blocks ° °1. Numbness or the inability to move the "blocked" extremity may last from 3-48 hours after placement. The length of time depends on the medication injected and your individual response to the medication. If the numbness is not going away after 48 hours, call your surgeon. ° °2. The extremity that is blocked will need to be protected until the numbness is gone and the  Strength has returned. Because you cannot feel it, you will need to take extra care to avoid injury. Because it may be weak, you may have difficulty moving it or using it. You may not know what position it is in without looking at it while the block is in effect. ° °3. For blocks in the legs and feet, returning to weight bearing and walking needs to be done carefully. You will need to wait until the numbness is entirely gone and the strength has returned. You should be able to move your leg  and foot normally before you try and bear weight or walk. You will need someone to be with you when you first try to ensure you do not fall and possibly risk injury. ° °4. Bruising and tenderness at the needle site are common side effects and will resolve in a few days. ° °5. Persistent numbness or new problems with movement should be communicated to the surgeon or the [facility] ([phone])/ [facility] ([phone]). ° ° ° °Post Anesthesia Home Care Instructions ° °Activity: °Get plenty of rest for the remainder of the day. A responsible individual must stay with you for 24 hours following the procedure.  °For the next 24 hours, DO NOT: °-Drive a car °-Operate machinery °-Drink alcoholic beverages °-Take any medication unless instructed by your physician °-Make any legal decisions or sign important papers. ° °Meals: °Start with liquid foods such as gelatin or soup. Progress to regular foods as tolerated. Avoid greasy, spicy, heavy foods. If nausea and/or vomiting occur, drink only clear liquids until the nausea and/or vomiting subsides. Call your physician if vomiting continues. ° °Special Instructions/Symptoms: °Your throat may feel dry or sore from the anesthesia or the breathing tube placed in your throat during surgery. If this causes discomfort, gargle with warm salt water. The discomfort should disappear within 24 hours. ° °If you had a scopolamine patch placed behind your ear for   the management of post- operative nausea and/or vomiting: ° °1. The medication in the patch is effective for 72 hours, after which it should be removed.  Wrap patch in a tissue and discard in the trash. Wash hands thoroughly with soap and water. °2. You may remove the patch earlier than 72 hours if you experience unpleasant side effects which may include dry mouth, dizziness or visual disturbances. °3. Avoid touching the patch. Wash your hands with soap and water after contact with the patch. °  ° ° °

## 2019-06-30 NOTE — H&P (Signed)
Brenda Serrano is an 48 y.o. female.   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 Marland Kitchen She was referred for MRI at that time. She has positive nerve conductions feeling carpal tunnel syndrome on her right side. She states the original injury to her thumb was in January. Continues to complain of pain with gripping and pinching 5-6 over 10. She complains of a relatively constant numbness and tingling of her fingers on right hand waking her at night. She has had injections to the carpal canal with temporary relief. Her nerve conductions by Dr. Brien Few are positive bilaterally. She localized the pain on the ulnar side of her thumb primarily metacarpal phalangeal joint. She has a history of thyroid problems no history of diabetes arthritis or gout. Family history is positive thyroid problems negative for remainder   Past Medical History:  Diagnosis Date  . Anxiety   . Arthritis   . Asthma    irritant induced with smoke  . Hypothyroidism   . Pneumonia   . Thyroid disease     Past Surgical History:  Procedure Laterality Date  . CESAREAN SECTION    . STRABISMUS SURGERY     x2  . TOE AMPUTATION Right    small toe  . TUBAL LIGATION      History reviewed. No pertinent family history. Social History:  reports that she has never smoked. She has never used smokeless tobacco. She reports previous alcohol use. She reports that she does not use drugs.  Allergies:  Allergies  Allergen Reactions  . Amoxicillin   . Gabapentin   . Nyquil Multi-Symptom [Pseudoeph-Doxylamine-Dm-Apap]   . Trazodone   . Ciprofloxacin Rash    No medications prior to admission.    No results found for this or any previous visit (from the past 48 hour(s)).  No results found.   Pertinent items are noted in HPI.  Height 5\' 2"  (1.575 m), weight 80.3 kg.  General  appearance: alert, cooperative and appears stated age Head: Normocephalic, without obvious abnormality Neck: no JVD Resp: clear to auscultation bilaterally Cardio: regular rate and rhythm, S1, S2 normal, no murmur, click, rub or gallop GI: soft, non-tender; bowel sounds normal; no masses,  no organomegaly Extremities: Numbness right hand and pain metacarpal phalangeal joint right thumb Pulses: 2+ and symmetric Skin: Skin color, texture, turgor normal. No rashes or lesions Neurologic: Grossly normal Incision/Wound: na  Assessment/Plan Assessment:  1. Gamekeeper's thumb  2. Carpal tunnel syndrome of right wrist    Plan: We have discussed repair or reconstruction of the ulnar collateral ligament metacarpal phalangeal joint of her right thumb along with carpal tunnel release. Pre-peri-and postoperative course are discussed along with risks and complications. She is aware that there is no guarantee to the surgery the possibility of infection recurrence injury to arteries nerves tendons complete relief symptoms and dystrophy. Would like to proceed this is scheduled as an outpatient. She is advised this may require abductor pollicis longus tendon transfer for reconstruction to the ulnar collateral ligament. She has vies we are going for stability rather than mobility of the thumb. Questions are encouraged and answered to her satisfaction. This scheduled as an outpatient under regional anesthesia.     Daryll Brod 06/30/2019, 7:45 AM

## 2019-07-01 ENCOUNTER — Encounter (HOSPITAL_BASED_OUTPATIENT_CLINIC_OR_DEPARTMENT_OTHER): Payer: Self-pay | Admitting: Orthopedic Surgery

## 2019-12-18 ENCOUNTER — Other Ambulatory Visit: Payer: Self-pay | Admitting: Orthopedic Surgery

## 2019-12-28 ENCOUNTER — Encounter (HOSPITAL_BASED_OUTPATIENT_CLINIC_OR_DEPARTMENT_OTHER): Payer: Self-pay | Admitting: Orthopedic Surgery

## 2019-12-28 ENCOUNTER — Other Ambulatory Visit: Payer: Self-pay

## 2019-12-29 ENCOUNTER — Other Ambulatory Visit (HOSPITAL_COMMUNITY): Payer: Medicare Other

## 2019-12-29 ENCOUNTER — Other Ambulatory Visit (HOSPITAL_COMMUNITY)
Admission: RE | Admit: 2019-12-29 | Discharge: 2019-12-29 | Disposition: A | Discharge status: Home / Self Care | Payer: Medicare Other | Source: Ambulatory Visit | Attending: Orthopedic Surgery | Admitting: Orthopedic Surgery

## 2019-12-29 DIAGNOSIS — Z20822 Contact with and (suspected) exposure to covid-19: Secondary | ICD-10-CM | POA: Diagnosis not present

## 2019-12-29 DIAGNOSIS — Z01812 Encounter for preprocedural laboratory examination: Secondary | ICD-10-CM | POA: Insufficient documentation

## 2019-12-29 LAB — SARS CORONAVIRUS 2 (TAT 6-24 HRS): SARS Coronavirus 2: NEGATIVE

## 2020-01-01 ENCOUNTER — Encounter (HOSPITAL_BASED_OUTPATIENT_CLINIC_OR_DEPARTMENT_OTHER): Payer: Self-pay | Admitting: Orthopedic Surgery

## 2020-01-01 ENCOUNTER — Ambulatory Visit (HOSPITAL_BASED_OUTPATIENT_CLINIC_OR_DEPARTMENT_OTHER): Payer: Medicare Other | Admitting: Anesthesiology

## 2020-01-01 ENCOUNTER — Encounter (HOSPITAL_BASED_OUTPATIENT_CLINIC_OR_DEPARTMENT_OTHER)
Admission: RE | Disposition: A | Discharge status: Home / Self Care | Payer: Self-pay | Source: Home / Self Care | Attending: Orthopedic Surgery

## 2020-01-01 ENCOUNTER — Other Ambulatory Visit: Payer: Self-pay

## 2020-01-01 ENCOUNTER — Ambulatory Visit (HOSPITAL_BASED_OUTPATIENT_CLINIC_OR_DEPARTMENT_OTHER)
Admission: RE | Admit: 2020-01-01 | Discharge: 2020-01-01 | Disposition: A | Discharge status: Home / Self Care | Payer: Medicare Other | Attending: Orthopedic Surgery | Admitting: Orthopedic Surgery

## 2020-01-01 DIAGNOSIS — G5602 Carpal tunnel syndrome, left upper limb: Secondary | ICD-10-CM | POA: Insufficient documentation

## 2020-01-01 DIAGNOSIS — J45909 Unspecified asthma, uncomplicated: Secondary | ICD-10-CM | POA: Diagnosis not present

## 2020-01-01 DIAGNOSIS — Z888 Allergy status to other drugs, medicaments and biological substances status: Secondary | ICD-10-CM | POA: Diagnosis not present

## 2020-01-01 DIAGNOSIS — Z880 Allergy status to penicillin: Secondary | ICD-10-CM | POA: Insufficient documentation

## 2020-01-01 DIAGNOSIS — Z881 Allergy status to other antibiotic agents status: Secondary | ICD-10-CM | POA: Diagnosis not present

## 2020-01-01 DIAGNOSIS — M1990 Unspecified osteoarthritis, unspecified site: Secondary | ICD-10-CM | POA: Diagnosis not present

## 2020-01-01 HISTORY — PX: CARPAL TUNNEL RELEASE: SHX101

## 2020-01-01 LAB — POCT PREGNANCY, URINE: Preg Test, Ur: NEGATIVE

## 2020-01-01 SURGERY — CARPAL TUNNEL RELEASE
Anesthesia: Regional | Site: Hand | Laterality: Left

## 2020-01-01 MED ORDER — PROMETHAZINE HCL 25 MG/ML IJ SOLN
INTRAMUSCULAR | Status: AC
  Filled 2020-01-01: qty 1

## 2020-01-01 MED ORDER — LACTATED RINGERS IV SOLN: INTRAVENOUS | Status: DC

## 2020-01-01 MED ORDER — ONDANSETRON HCL 4 MG/2ML IJ SOLN: 4.0000 mg | Freq: Once | INTRAMUSCULAR | Status: DC | PRN

## 2020-01-01 MED ORDER — BUPIVACAINE HCL (PF) 0.25 % IJ SOLN
INTRAMUSCULAR | Status: DC | PRN
  Administered 2020-01-01: 10 mL

## 2020-01-01 MED ORDER — MIDAZOLAM HCL 2 MG/2ML IJ SOLN
INTRAMUSCULAR | Status: AC
  Filled 2020-01-01: qty 2

## 2020-01-01 MED ORDER — ACETAMINOPHEN 10 MG/ML IV SOLN: 1000.0000 mg | Freq: Once | INTRAVENOUS | Status: DC | PRN

## 2020-01-01 MED ORDER — PROMETHAZINE HCL 25 MG/ML IJ SOLN
6.2500 mg | Freq: Once | INTRAMUSCULAR | Status: AC
  Administered 2020-01-01: 6.25 mg via INTRAVENOUS

## 2020-01-01 MED ORDER — MIDAZOLAM HCL 5 MG/5ML IJ SOLN
INTRAMUSCULAR | Status: DC | PRN
  Administered 2020-01-01: 2 mg via INTRAVENOUS

## 2020-01-01 MED ORDER — ONDANSETRON HCL 4 MG/2ML IJ SOLN
INTRAMUSCULAR | Status: DC | PRN
  Administered 2020-01-01: 4 mg via INTRAVENOUS

## 2020-01-01 MED ORDER — LIDOCAINE HCL (PF) 0.5 % IJ SOLN
INTRAMUSCULAR | Status: DC | PRN
  Administered 2020-01-01: 30 mL via INTRAVENOUS

## 2020-01-01 MED ORDER — ONDANSETRON HCL 4 MG/2ML IJ SOLN
INTRAMUSCULAR | Status: AC
  Filled 2020-01-01: qty 2

## 2020-01-01 MED ORDER — PHENYLEPHRINE 40 MCG/ML (10ML) SYRINGE FOR IV PUSH (FOR BLOOD PRESSURE SUPPORT)
PREFILLED_SYRINGE | INTRAVENOUS | Status: AC
  Filled 2020-01-01: qty 10

## 2020-01-01 MED ORDER — CLINDAMYCIN PHOSPHATE 900 MG/50ML IV SOLN
900.0000 mg | INTRAVENOUS | Status: AC
  Administered 2020-01-01: 900 mg via INTRAVENOUS

## 2020-01-01 MED ORDER — TRAMADOL HCL 50 MG PO TABS: 50.0000 mg | ORAL_TABLET | Freq: Four times a day (QID) | ORAL | 0 refills | Status: DC | PRN

## 2020-01-01 MED ORDER — CHLORHEXIDINE GLUCONATE 4 % EX LIQD: 60.0000 mL | Freq: Once | CUTANEOUS | Status: DC

## 2020-01-01 MED ORDER — LIDOCAINE 2% (20 MG/ML) 5 ML SYRINGE
INTRAMUSCULAR | Status: AC
  Filled 2020-01-01: qty 5

## 2020-01-01 MED ORDER — SUCCINYLCHOLINE CHLORIDE 200 MG/10ML IV SOSY
PREFILLED_SYRINGE | INTRAVENOUS | Status: AC
  Filled 2020-01-01: qty 10

## 2020-01-01 MED ORDER — PROPOFOL 10 MG/ML IV BOLUS
INTRAVENOUS | Status: DC | PRN
  Administered 2020-01-01 (×2): 20 mg via INTRAVENOUS

## 2020-01-01 MED ORDER — FENTANYL CITRATE (PF) 100 MCG/2ML IJ SOLN
INTRAMUSCULAR | Status: AC
  Filled 2020-01-01: qty 2

## 2020-01-01 MED ORDER — PROPOFOL 500 MG/50ML IV EMUL
INTRAVENOUS | Status: AC
  Filled 2020-01-01: qty 50

## 2020-01-01 MED ORDER — CEFAZOLIN SODIUM-DEXTROSE 2-4 GM/100ML-% IV SOLN
INTRAVENOUS | Status: AC
  Filled 2020-01-01: qty 100

## 2020-01-01 MED ORDER — CLINDAMYCIN PHOSPHATE 900 MG/50ML IV SOLN
INTRAVENOUS | Status: AC
  Filled 2020-01-01: qty 50

## 2020-01-01 MED ORDER — OXYCODONE HCL 5 MG PO TABS
ORAL_TABLET | ORAL | Status: AC
  Filled 2020-01-01: qty 1

## 2020-01-01 MED ORDER — EPHEDRINE 5 MG/ML INJ
INTRAVENOUS | Status: AC
  Filled 2020-01-01: qty 10

## 2020-01-01 MED ORDER — ONDANSETRON 4 MG PO TBDP
4.0000 mg | ORAL_TABLET | Freq: Once | ORAL | Status: AC
  Administered 2020-01-01: 14:00:00 4 mg via ORAL

## 2020-01-01 MED ORDER — FENTANYL CITRATE (PF) 100 MCG/2ML IJ SOLN
INTRAMUSCULAR | Status: DC | PRN
  Administered 2020-01-01 (×2): 50 ug via INTRAVENOUS

## 2020-01-01 MED ORDER — FENTANYL CITRATE (PF) 100 MCG/2ML IJ SOLN: 25.0000 ug | INTRAMUSCULAR | Status: DC | PRN

## 2020-01-01 MED ORDER — PROPOFOL 10 MG/ML IV BOLUS
INTRAVENOUS | Status: AC
  Filled 2020-01-01: qty 20

## 2020-01-01 MED ORDER — PROPOFOL 500 MG/50ML IV EMUL: INTRAVENOUS | Status: DC | PRN

## 2020-01-01 MED ORDER — OXYCODONE HCL 5 MG PO TABS
5.0000 mg | ORAL_TABLET | Freq: Once | ORAL | Status: AC
  Administered 2020-01-01: 15:00:00 5 mg via ORAL

## 2020-01-01 MED ORDER — ONDANSETRON 4 MG PO TBDP
ORAL_TABLET | ORAL | Status: AC
  Filled 2020-01-01: qty 1

## 2020-01-01 SURGICAL SUPPLY — 35 items
BLADE SURG 15 STRL LF DISP TIS (BLADE) ×1 IMPLANT
BLADE SURG 15 STRL SS (BLADE) ×1
BNDG COHESIVE 3X5 TAN STRL LF (GAUZE/BANDAGES/DRESSINGS) ×2 IMPLANT
BNDG ESMARK 4X9 LF (GAUZE/BANDAGES/DRESSINGS) IMPLANT
BNDG GAUZE ELAST 4 BULKY (GAUZE/BANDAGES/DRESSINGS) ×2 IMPLANT
CHLORAPREP W/TINT 26 (MISCELLANEOUS) ×2 IMPLANT
CORD BIPOLAR FORCEPS 12FT (ELECTRODE) ×2 IMPLANT
COVER BACK TABLE 60X90IN (DRAPES) ×2 IMPLANT
COVER MAYO STAND STRL (DRAPES) ×2 IMPLANT
COVER WAND RF STERILE (DRAPES) IMPLANT
CUFF TOURN SGL QUICK 18X4 (TOURNIQUET CUFF) ×2 IMPLANT
DRAPE EXTREMITY T 121X128X90 (DISPOSABLE) ×2 IMPLANT
DRAPE SURG 17X23 STRL (DRAPES) ×2 IMPLANT
DRSG PAD ABDOMINAL 8X10 ST (GAUZE/BANDAGES/DRESSINGS) ×2 IMPLANT
GAUZE SPONGE 4X4 12PLY STRL (GAUZE/BANDAGES/DRESSINGS) ×2 IMPLANT
GAUZE XEROFORM 1X8 LF (GAUZE/BANDAGES/DRESSINGS) ×2 IMPLANT
GLOVE BIOGEL PI IND STRL 7.0 (GLOVE) ×1 IMPLANT
GLOVE BIOGEL PI IND STRL 8.5 (GLOVE) ×1 IMPLANT
GLOVE BIOGEL PI INDICATOR 7.0 (GLOVE) ×1
GLOVE BIOGEL PI INDICATOR 8.5 (GLOVE) ×1
GLOVE ECLIPSE 6.5 STRL STRAW (GLOVE) ×2 IMPLANT
GLOVE SURG ORTHO 8.0 STRL STRW (GLOVE) ×2 IMPLANT
GOWN STRL REUS W/ TWL LRG LVL3 (GOWN DISPOSABLE) ×1 IMPLANT
GOWN STRL REUS W/TWL LRG LVL3 (GOWN DISPOSABLE) ×1
GOWN STRL REUS W/TWL XL LVL3 (GOWN DISPOSABLE) ×2 IMPLANT
NEEDLE PRECISIONGLIDE 27X1.5 (NEEDLE) ×2 IMPLANT
NS IRRIG 1000ML POUR BTL (IV SOLUTION) ×2 IMPLANT
PACK BASIN DAY SURGERY FS (CUSTOM PROCEDURE TRAY) ×2 IMPLANT
STOCKINETTE 4X48 STRL (DRAPES) ×2 IMPLANT
SUT ETHILON 4 0 PS 2 18 (SUTURE) ×2 IMPLANT
SUT VICRYL 4-0 PS2 18IN ABS (SUTURE) IMPLANT
SYR BULB 3OZ (MISCELLANEOUS) ×2 IMPLANT
SYR CONTROL 10ML LL (SYRINGE) ×2 IMPLANT
TOWEL GREEN STERILE FF (TOWEL DISPOSABLE) ×2 IMPLANT
UNDERPAD 30X36 HEAVY ABSORB (UNDERPADS AND DIAPERS) ×2 IMPLANT

## 2020-01-01 NOTE — H&P (Signed)
Brenda Serrano is an 49 y.o. female.   Chief Complaint: numbness left hand HPI:  Brenda Serrano is a 49 yo female seen following right carpal tunnel release and gamekeeper's thumb repair. She Is doing well as far as her right hand was concerned but is now complaining of continuation of numbness and tingling of her left side. She has had nerve conductions done by Dr. Assunta Curtis which were positive bilaterally. She has a history of thyroid problems no history of diabetes arthritis or gout. Family history is positive thyroid problems negative for the remainder. She is complaining of her left side waking her up almost every night. She states her right side is doing extremely well. She compains of numbness tingling median nerve distribution.  Past Medical History:  Diagnosis Date  . Anxiety   . Arthritis   . Asthma    irritant induced with smoke  . Hypothyroidism   . Pneumonia   . Thyroid disease     Past Surgical History:  Procedure Laterality Date  . CARPAL TUNNEL RELEASE Right 06/30/2019   Procedure: CARPAL TUNNEL RELEASE;  Surgeon: Daryll Brod, MD;  Location: [place];  Service: Orthopedics;  Laterality: Right;  . CESAREAN SECTION    . STRABISMUS SURGERY     x2  . TOE AMPUTATION Right    small toe  . TUBAL LIGATION    . ULNAR COLLATERAL LIGAMENT REPAIR Right 06/30/2019   Procedure: REPAIR RECONSTRUCTION ULNAR COLLATERAL LIGAMENT RIGHT THUMB;  Surgeon: Daryll Brod, MD;  Location: [place];  Service: Orthopedics;  Laterality: Right;  AXILLARY BLOCK    History reviewed. No pertinent family history. Social History:  reports that she has never smoked. She has never used smokeless tobacco. She reports previous alcohol use. She reports that she does not use drugs.  Allergies:  Allergies  Allergen Reactions  . Keflex [Cephalexin] Shortness Of Breath  . Amoxicillin Swelling    Hives and rash, swelling in face and arms   . Gabapentin Other (See Comments)    Edema  in lower ext   . Nyquil Multi-Symptom [Pseudoeph-Doxylamine-Dm-Apap] Itching    And hives   . Trazodone Other (See Comments)    Pt states it was a long time ago, felt like throat was closing   . Ciprofloxacin Hives    No medications prior to admission.    No results found for this or any previous visit (from the past 48 hour(s)).  No results found.   Pertinent items are noted in HPI.  Height 5\' 2"  (1.575 m), weight 72.1 kg.  General appearance: alert, cooperative and appears stated age Head: Normocephalic, without obvious abnormality Neck: no adenopathy, no carotid bruit, no JVD, supple, symmetrical, trachea midline and thyroid not enlarged, symmetric, no tenderness/mass/nodules Resp: clear to auscultation bilaterally Cardio: regular rate and rhythm, S1, S2 normal, no murmur, click, rub or gallop GI: soft, non-tender; bowel sounds normal; no masses,  no organomegaly Extremities: numbness left hand Pulses: 2+ and symmetric Skin: Skin color, texture, turgor normal. No rashes or lesions Neurologic: Grossly normal Incision/Wound: na  Assessment/Plan Diagnosis carpal tunnel syndrome left hand that is post release on the right side along with repair gamekeeper's thumb  Plan:  She would like to proceed to have the left carpal tunnel release. Preperi-and postoperative course been discussed along with risk complications. She is aware there is no guarantee to the surgery the possibility of infection recurrence injury to arteries nerves tendons complete relief symptoms and dystrophy. He is scheduled for left  carpal tunnel release in outpatient under regional anesthesia.    [name] 01/01/2020, 6:47 AM

## 2020-01-01 NOTE — Discharge Instructions (Addendum)

## 2020-01-01 NOTE — Transfer of Care (Signed)
Immediate Anesthesia Transfer of Care Note  Patient: Brenda Serrano  Procedure(s) Performed: LEFT CARPAL TUNNEL RELEASE (Left Hand)  Patient Location: PACU  Anesthesia Type:MAC and Bier block  Level of Consciousness: awake, alert  and oriented  Airway & Oxygen Therapy: Patient Spontanous Breathing and Patient connected to face mask oxygen  Post-op Assessment: Report given to RN and Post -op Vital signs reviewed and stable  Post vital signs: Reviewed and stable  Last Vitals:  Vitals Value Taken Time  BP 136/73 01/01/20 1339  Temp    Pulse 78 01/01/20 1340  Resp 21 01/01/20 1340  SpO2 100 % 01/01/20 1340  Vitals shown include unvalidated device data.  Last Pain:  Vitals:   01/01/20 1112  TempSrc: Temporal  PainSc: 0-No pain      Patients Stated Pain Goal: 2 (24/46/28 6381)  Complications: No apparent anesthesia complications

## 2020-01-01 NOTE — Brief Op Note (Signed)
01/01/2020  1:35 PM  PATIENT:  Francesco Sor  49 y.o. female  PRE-OPERATIVE DIAGNOSIS:  LEFT CARPAL TUNNEL SYNDROME  POST-OPERATIVE DIAGNOSIS:  LEFT CARPAL TUNNEL SYNDROME  PROCEDURE:  Procedure(s) with comments: LEFT CARPAL TUNNEL RELEASE (Left) - IV REGIONAL FOREARM BLOCK  SURGEON:  Surgeon(s) and Role:    * [name], MD - Primary  PHYSICIAN ASSISTANT:   ASSISTANTS: none   ANESTHESIA:   local, regional and IV sedation  EBL:  5 mL   BLOOD ADMINISTERED:none  DRAINS: none   LOCAL MEDICATIONS USED:  BUPIVICAINE   SPECIMEN:  No Specimen  DISPOSITION OF SPECIMEN:  N/A  COUNTS:  YES  TOURNIQUET:   Total Tourniquet Time Documented: Forearm (Left) - 23 minutes Total: Forearm (Left) - 23 minutes   DICTATION: .Reubin Milan Dictation  PLAN OF CARE: Discharge to home after PACU  PATIENT DISPOSITION:  PACU - hemodynamically stable.

## 2020-01-01 NOTE — Op Note (Signed)
NAME: Brenda Serrano MEDICAL RECORD NO: [id number] DATE OF BIRTH: 03-25-1971 FACILITY: [facility] LOCATION:  SURGERY CENTER PHYSICIAN: [name], MD   OPERATIVE REPORT   DATE OF PROCEDURE: 01/01/20    PREOPERATIVE DIAGNOSIS:   Carpal tunnel syndrome left hand   POSTOPERATIVE DIAGNOSIS:   Same   PROCEDURE:   Decompression median nerve left hand   SURGEON: [name], M.D.   ASSISTANT: none   ANESTHESIA:  Bier block with sedation and Local   INTRAVENOUS FLUIDS:  Per anesthesia flow sheet.   ESTIMATED BLOOD LOSS:  Minimal.   COMPLICATIONS:  None.   SPECIMENS:  none   TOURNIQUET TIME:    Total Tourniquet Time Documented: Forearm (Left) - 23 minutes Total: Forearm (Left) - 23 minutes    DISPOSITION:  Stable to PACU.   INDICATIONS: Patient is a 49 year old female with a history of numbness and tingling both hands nerve conductions are positive.  This not responded to conservative treatment she is elected undergo surgical release.  She has undergone release on the right side is admitted now for decompression median nerve left.  Preperi-and postoperative course been discussed along with risks and complications.  She is aware there is no guarantee to the surgery the possibility of infection recurrence injury to arteries nerves tendons incomplete relief of symptoms and dystrophy.  In the preoperative area the patient is seen the extremity marked by both patient and surgeon antibiotic given  OPERATIVE COURSE: Patient is brought to the operating room where form based IV regional anesthetic was carried out without difficulty.  She was prepped using ChloraPrep in the supine position with left arm free.  A 3-minute dry time was allowed and a timeout taken to confirm patient and procedure.  A longitudinal incision was made in the left palm carried down through subcutaneous tissue.  Bleeders were electrocauterized with bipolar.  The palmar fascia was split.  The superficial  palmar arch was identified along with flexor tendon to the ring little finger.  Retractors were placed retracting the median nerve flexor tendons radially and ulnar nerve ulnarly.  The flexor retinaculum was then released on its ulnar border.  A right angle and stool retractor placed between skin and forearm fascia.  Deep structures were dissected free with blunt dissection.  Blunt scissors were then used to release the proximal aspect of the palmar fascia distal forearm fascia for approximately a centimeter and 1/2 to 2 cm proximal to the wrist crease under direct vision.  The canal was explored.  An area compression of the nerve was apparent.  Motor branch was visualized entering into muscle distally.  The wound was copiously irrigated with saline.  The skin was closed interrupted 4 nylon sutures.  Local infiltration quarter percent bupivacaine without epinephrine was given approximately 9 cc was used.  A sterile compressive dressing with the fingers free was applied.  On Deflation of the tourniquet all fingers immediately pink.  She was taken to the recovery room for observation in satisfactory condition.  She will be discharged home to return [facility] center of [facility] in 1 week on Tylenol ibuprofen for pain with Ultram for breakthrough.  [name], MD Electronically signed, 01/01/20

## 2020-01-01 NOTE — Anesthesia Postprocedure Evaluation (Signed)
Anesthesia Post Note  Patient: Oceana Walthall  Procedure(s) Performed: LEFT CARPAL TUNNEL RELEASE (Left Hand)     Patient location during evaluation: PACU Anesthesia Type: Bier Block Level of consciousness: awake and alert Pain management: pain level controlled Vital Signs Assessment: post-procedure vital signs reviewed and stable Respiratory status: spontaneous breathing, nonlabored ventilation, respiratory function stable and patient connected to nasal cannula oxygen Cardiovascular status: stable and blood pressure returned to baseline Postop Assessment: no apparent nausea or vomiting Anesthetic complications: no    Last Vitals:  Vitals:   01/01/20 1415 01/01/20 1430  BP: 114/69 126/88  Pulse: 75 64  Resp: 18 16  Temp:    SpO2: 100% 100%    Last Pain:  Vitals:   01/01/20 1429  TempSrc:   PainSc: 0-No pain                 Trevor Iha

## 2020-01-01 NOTE — Anesthesia Preprocedure Evaluation (Signed)
Anesthesia Evaluation  Patient identified by MRN, date of birth, ID band Patient awake    Reviewed: Allergy & Precautions, NPO status , Patient's Chart, lab work & pertinent test results  Airway Mallampati: II  TM Distance: >3 FB Neck ROM: Full    Dental no notable dental hx. (+) Teeth Intact, Dental Advisory Given   Pulmonary neg pulmonary ROS,    Pulmonary exam normal breath sounds clear to auscultation       Cardiovascular Exercise Tolerance: Good negative cardio ROS Normal cardiovascular exam Rhythm:Regular Rate:Normal     Neuro/Psych negative neurological ROS     GI/Hepatic negative GI ROS, Neg liver ROS,   Endo/Other  negative endocrine ROSHypothyroidism   Renal/GU negative Renal ROS     Musculoskeletal   Abdominal   Peds  Hematology   Anesthesia Other Findings   Reproductive/Obstetrics                             Anesthesia Physical Anesthesia Plan  ASA: II  Anesthesia Plan: Bier Block and Bier Block-LIDOCAINE ONLY   Post-op Pain Management:    Induction: Intravenous  PONV Risk Score and Plan: 2 and Treatment may vary due to age or medical condition  Airway Management Planned: Nasal Cannula and Natural Airway  Additional Equipment: None  Intra-op Plan:   Post-operative Plan:   Informed Consent: I have reviewed the patients History and Physical, chart, labs and discussed the procedure including the risks, benefits and alternatives for the proposed anesthesia with the patient or authorized representative who has indicated his/her understanding and acceptance.     Dental advisory given  Plan Discussed with: CRNA  Anesthesia Plan Comments:         Anesthesia Quick Evaluation

## 2020-01-04 ENCOUNTER — Encounter: Payer: Self-pay | Admitting: *Deleted

## 2020-04-20 ENCOUNTER — Other Ambulatory Visit: Payer: Self-pay

## 2020-04-20 ENCOUNTER — Encounter (HOSPITAL_BASED_OUTPATIENT_CLINIC_OR_DEPARTMENT_OTHER): Payer: Self-pay | Admitting: Orthopedic Surgery

## 2020-04-20 ENCOUNTER — Other Ambulatory Visit: Payer: Self-pay | Admitting: Orthopedic Surgery

## 2020-04-21 NOTE — Progress Notes (Signed)

## 2020-04-22 ENCOUNTER — Other Ambulatory Visit (HOSPITAL_COMMUNITY)
Admission: RE | Admit: 2020-04-22 | Discharge: 2020-04-22 | Disposition: A | Discharge status: Home / Self Care | Payer: Medicare Other | Source: Ambulatory Visit | Attending: Orthopedic Surgery | Admitting: Orthopedic Surgery

## 2020-04-22 DIAGNOSIS — Z01812 Encounter for preprocedural laboratory examination: Secondary | ICD-10-CM | POA: Diagnosis present

## 2020-04-22 DIAGNOSIS — Z20822 Contact with and (suspected) exposure to covid-19: Secondary | ICD-10-CM | POA: Diagnosis not present

## 2020-04-23 LAB — SARS CORONAVIRUS 2 (TAT 6-24 HRS): SARS Coronavirus 2: NEGATIVE

## 2020-04-26 ENCOUNTER — Ambulatory Visit (HOSPITAL_BASED_OUTPATIENT_CLINIC_OR_DEPARTMENT_OTHER)
Admission: RE | Admit: 2020-04-26 | Discharge: 2020-04-26 | Disposition: A | Discharge status: Home / Self Care | Payer: Medicare Other | Attending: Orthopedic Surgery | Admitting: Orthopedic Surgery

## 2020-04-26 ENCOUNTER — Other Ambulatory Visit: Payer: Self-pay

## 2020-04-26 ENCOUNTER — Encounter (HOSPITAL_BASED_OUTPATIENT_CLINIC_OR_DEPARTMENT_OTHER)
Admission: RE | Disposition: A | Discharge status: Home / Self Care | Payer: Self-pay | Source: Home / Self Care | Attending: Orthopedic Surgery

## 2020-04-26 ENCOUNTER — Ambulatory Visit (HOSPITAL_BASED_OUTPATIENT_CLINIC_OR_DEPARTMENT_OTHER): Payer: Medicare Other | Admitting: Anesthesiology

## 2020-04-26 ENCOUNTER — Encounter (HOSPITAL_BASED_OUTPATIENT_CLINIC_OR_DEPARTMENT_OTHER): Payer: Self-pay | Admitting: Orthopedic Surgery

## 2020-04-26 DIAGNOSIS — Z880 Allergy status to penicillin: Secondary | ICD-10-CM | POA: Insufficient documentation

## 2020-04-26 DIAGNOSIS — Z888 Allergy status to other drugs, medicaments and biological substances status: Secondary | ICD-10-CM | POA: Diagnosis not present

## 2020-04-26 DIAGNOSIS — Z89421 Acquired absence of other right toe(s): Secondary | ICD-10-CM | POA: Insufficient documentation

## 2020-04-26 DIAGNOSIS — J45909 Unspecified asthma, uncomplicated: Secondary | ICD-10-CM | POA: Insufficient documentation

## 2020-04-26 DIAGNOSIS — G5622 Lesion of ulnar nerve, left upper limb: Secondary | ICD-10-CM | POA: Diagnosis not present

## 2020-04-26 DIAGNOSIS — Z881 Allergy status to other antibiotic agents status: Secondary | ICD-10-CM | POA: Diagnosis not present

## 2020-04-26 DIAGNOSIS — E039 Hypothyroidism, unspecified: Secondary | ICD-10-CM | POA: Insufficient documentation

## 2020-04-26 DIAGNOSIS — M1990 Unspecified osteoarthritis, unspecified site: Secondary | ICD-10-CM | POA: Insufficient documentation

## 2020-04-26 DIAGNOSIS — F419 Anxiety disorder, unspecified: Secondary | ICD-10-CM | POA: Insufficient documentation

## 2020-04-26 HISTORY — PX: ULNAR NERVE TRANSPOSITION: SHX2595

## 2020-04-26 LAB — POCT PREGNANCY, URINE: Preg Test, Ur: NEGATIVE

## 2020-04-26 SURGERY — ULNAR NERVE DECOMPRESSION/TRANSPOSITION
Anesthesia: Monitor Anesthesia Care | Site: Elbow | Laterality: Left

## 2020-04-26 MED ORDER — MIDAZOLAM HCL 2 MG/2ML IJ SOLN
INTRAMUSCULAR | Status: AC
  Filled 2020-04-26: qty 2

## 2020-04-26 MED ORDER — FENTANYL CITRATE (PF) 100 MCG/2ML IJ SOLN
INTRAMUSCULAR | Status: DC | PRN
  Administered 2020-04-26: 25 ug via INTRAVENOUS
  Administered 2020-04-26: 50 ug via INTRAVENOUS
  Administered 2020-04-26: 25 ug via INTRAVENOUS

## 2020-04-26 MED ORDER — MIDAZOLAM HCL 5 MG/5ML IJ SOLN
INTRAMUSCULAR | Status: DC | PRN
  Administered 2020-04-26: 2 mg via INTRAVENOUS

## 2020-04-26 MED ORDER — DEXAMETHASONE SODIUM PHOSPHATE 4 MG/ML IJ SOLN
INTRAMUSCULAR | Status: DC | PRN
  Administered 2020-04-26: 10 mg via INTRAVENOUS

## 2020-04-26 MED ORDER — VANCOMYCIN HCL IN DEXTROSE 1-5 GM/200ML-% IV SOLN
INTRAVENOUS | Status: AC
  Filled 2020-04-26: qty 200

## 2020-04-26 MED ORDER — FENTANYL CITRATE (PF) 100 MCG/2ML IJ SOLN
50.0000 ug | INTRAMUSCULAR | Status: DC | PRN
  Administered 2020-04-26: 50 ug via INTRAVENOUS

## 2020-04-26 MED ORDER — ONDANSETRON HCL 4 MG/2ML IJ SOLN
INTRAMUSCULAR | Status: DC | PRN
  Administered 2020-04-26: 4 mg via INTRAVENOUS

## 2020-04-26 MED ORDER — FENTANYL CITRATE (PF) 100 MCG/2ML IJ SOLN
INTRAMUSCULAR | Status: AC
  Filled 2020-04-26: qty 2

## 2020-04-26 MED ORDER — AMISULPRIDE (ANTIEMETIC) 5 MG/2ML IV SOLN
10.0000 mg | Freq: Once | INTRAVENOUS | Status: AC
  Administered 2020-04-26: 10 mg via INTRAVENOUS

## 2020-04-26 MED ORDER — KETOROLAC TROMETHAMINE 30 MG/ML IJ SOLN
INTRAMUSCULAR | Status: AC
  Filled 2020-04-26: qty 1

## 2020-04-26 MED ORDER — TRAMADOL HCL 50 MG PO TABS: 50.0000 mg | ORAL_TABLET | Freq: Four times a day (QID) | ORAL | 0 refills | Status: DC | PRN

## 2020-04-26 MED ORDER — VANCOMYCIN HCL IN DEXTROSE 1-5 GM/200ML-% IV SOLN
1000.0000 mg | INTRAVENOUS | Status: AC
  Administered 2020-04-26: 1000 mg via INTRAVENOUS

## 2020-04-26 MED ORDER — FENTANYL CITRATE (PF) 100 MCG/2ML IJ SOLN
25.0000 ug | INTRAMUSCULAR | Status: DC | PRN
  Administered 2020-04-26 (×2): 50 ug via INTRAVENOUS

## 2020-04-26 MED ORDER — AMISULPRIDE (ANTIEMETIC) 5 MG/2ML IV SOLN
INTRAVENOUS | Status: AC
  Filled 2020-04-26: qty 4

## 2020-04-26 MED ORDER — PROPOFOL 10 MG/ML IV BOLUS
INTRAVENOUS | Status: DC | PRN
  Administered 2020-04-26: 150 mg via INTRAVENOUS

## 2020-04-26 MED ORDER — MIDAZOLAM HCL 2 MG/2ML IJ SOLN
1.0000 mg | INTRAMUSCULAR | Status: DC | PRN
  Administered 2020-04-26: 1 mg via INTRAVENOUS

## 2020-04-26 MED ORDER — SCOPOLAMINE 1 MG/3DAYS TD PT72
MEDICATED_PATCH | TRANSDERMAL | Status: AC
  Filled 2020-04-26: qty 1

## 2020-04-26 MED ORDER — LACTATED RINGERS IV SOLN: INTRAVENOUS | Status: DC

## 2020-04-26 MED ORDER — ROPIVACAINE HCL 5 MG/ML IJ SOLN
INTRAMUSCULAR | Status: DC | PRN
  Administered 2020-04-26: 20 mL via EPIDURAL

## 2020-04-26 MED ORDER — KETOROLAC TROMETHAMINE 30 MG/ML IJ SOLN
30.0000 mg | Freq: Once | INTRAMUSCULAR | Status: AC | PRN
  Administered 2020-04-26: 30 mg via INTRAVENOUS

## 2020-04-26 MED ORDER — PROPOFOL 10 MG/ML IV BOLUS
INTRAVENOUS | Status: AC
  Filled 2020-04-26: qty 20

## 2020-04-26 SURGICAL SUPPLY — 53 items
BLADE MINI RND TIP GREEN BEAV (BLADE) ×1 IMPLANT
BLADE SURG 15 STRL LF DISP TIS (BLADE) ×1 IMPLANT
BLADE SURG 15 STRL SS (BLADE) ×1
BNDG COHESIVE 3X5 TAN STRL LF (GAUZE/BANDAGES/DRESSINGS) ×4 IMPLANT
BNDG ESMARK 4X9 LF (GAUZE/BANDAGES/DRESSINGS) ×2 IMPLANT
BNDG GAUZE ELAST 4 BULKY (GAUZE/BANDAGES/DRESSINGS) ×2 IMPLANT
CHLORAPREP W/TINT 26 (MISCELLANEOUS) ×2 IMPLANT
CORD BIPOLAR FORCEPS 12FT (ELECTRODE) ×2 IMPLANT
COVER BACK TABLE 60X90IN (DRAPES) ×2 IMPLANT
COVER MAYO STAND STRL (DRAPES) ×2 IMPLANT
COVER WAND RF STERILE (DRAPES) IMPLANT
CUFF TOURN SGL QUICK 18X3 (MISCELLANEOUS) ×2 IMPLANT
DECANTER SPIKE VIAL GLASS SM (MISCELLANEOUS) IMPLANT
DRAPE EXTREMITY T 121X128X90 (DISPOSABLE) ×2 IMPLANT
DRAPE SURG 17X23 STRL (DRAPES) ×2 IMPLANT
DRSG PAD ABDOMINAL 8X10 ST (GAUZE/BANDAGES/DRESSINGS) ×2 IMPLANT
GAUZE 4X4 16PLY RFD (DISPOSABLE) IMPLANT
GAUZE SPONGE 4X4 12PLY STRL (GAUZE/BANDAGES/DRESSINGS) ×2 IMPLANT
GAUZE XEROFORM 1X8 LF (GAUZE/BANDAGES/DRESSINGS) ×2 IMPLANT
GLOVE BIOGEL M STRL SZ7.5 (GLOVE) ×1 IMPLANT
GLOVE BIOGEL PI IND STRL 6.5 (GLOVE) IMPLANT
GLOVE BIOGEL PI IND STRL 7.0 (GLOVE) IMPLANT
GLOVE BIOGEL PI IND STRL 8.5 (GLOVE) ×1 IMPLANT
GLOVE BIOGEL PI INDICATOR 6.5 (GLOVE) ×1
GLOVE BIOGEL PI INDICATOR 7.0 (GLOVE) ×1
GLOVE BIOGEL PI INDICATOR 8.5 (GLOVE) ×1
GLOVE ECLIPSE 6.5 STRL STRAW (GLOVE) ×1 IMPLANT
GLOVE SURG ORTHO 8.0 STRL STRW (GLOVE) ×2 IMPLANT
GOWN STRL REUS W/ TWL LRG LVL3 (GOWN DISPOSABLE) ×1 IMPLANT
GOWN STRL REUS W/TWL LRG LVL3 (GOWN DISPOSABLE) ×1
GOWN STRL REUS W/TWL XL LVL3 (GOWN DISPOSABLE) ×3 IMPLANT
LOOP VESSEL MAXI BLUE (MISCELLANEOUS) IMPLANT
NDL PRECISIONGLIDE 27X1.5 (NEEDLE) IMPLANT
NEEDLE PRECISIONGLIDE 27X1.5 (NEEDLE) IMPLANT
NS IRRIG 1000ML POUR BTL (IV SOLUTION) ×2 IMPLANT
PAD CAST 3X4 CTTN HI CHSV (CAST SUPPLIES) IMPLANT
PAD CAST 4YDX4 CTTN HI CHSV (CAST SUPPLIES) ×1 IMPLANT
PADDING CAST COTTON 3X4 STRL (CAST SUPPLIES)
PADDING CAST COTTON 4X4 STRL (CAST SUPPLIES) ×1
SET BASIN DAY SURGERY F.S. (CUSTOM PROCEDURE TRAY) ×2 IMPLANT
SLEEVE SCD COMPRESS KNEE MED (MISCELLANEOUS) ×2 IMPLANT
SLING ARM FOAM STRAP LRG (SOFTGOODS) ×1 IMPLANT
SPLINT PLASTER CAST XFAST 3X15 (CAST SUPPLIES) IMPLANT
SPLINT PLASTER XTRA FASTSET 3X (CAST SUPPLIES)
STOCKINETTE 4X48 STRL (DRAPES) ×2 IMPLANT
SUT ETHILON 4 0 PS 2 18 (SUTURE) ×2 IMPLANT
SUT VIC AB 2-0 SH 27 (SUTURE) ×1
SUT VIC AB 2-0 SH 27XBRD (SUTURE) ×1 IMPLANT
SUT VICRYL 4-0 PS2 18IN ABS (SUTURE) ×2 IMPLANT
SYR BULB EAR ULCER 3OZ GRN STR (SYRINGE) ×2 IMPLANT
SYR CONTROL 10ML LL (SYRINGE) IMPLANT
TOWEL GREEN STERILE FF (TOWEL DISPOSABLE) ×3 IMPLANT
UNDERPAD 30X36 HEAVY ABSORB (UNDERPADS AND DIAPERS) ×1 IMPLANT

## 2020-04-26 NOTE — Transfer of Care (Signed)
Immediate Anesthesia Transfer of Care Note  Patient: Brenda Serrano  Procedure(s) Performed: ULNAR NERVE DECOMPRESSION   LEFT ELBOW (Left Elbow)  Patient Location: PACU  Anesthesia Type:GA combined with regional for post-op pain  Level of Consciousness: awake and patient cooperative  Airway & Oxygen Therapy: Patient Spontanous Breathing and Patient connected to face mask oxygen  Post-op Assessment: Report given to RN and Post -op Vital signs reviewed and stable  Post vital signs: Reviewed and stable  Last Vitals:  Vitals Value Taken Time  BP 113/69 04/26/20 0950  Temp    Pulse 88 04/26/20 0951  Resp    SpO2 100 % 04/26/20 0951  Vitals shown include unvalidated device data.  Last Pain:  Vitals:   04/26/20 0853  TempSrc:   PainSc: 0-No pain      Patients Stated Pain Goal: 3 (04/26/20 0853)  Complications: No apparent anesthesia complications

## 2020-04-26 NOTE — Op Note (Signed)
NAME: Brenda Serrano MEDICAL RECORD NO: [id number] DATE OF BIRTH: 1971-10-20 FACILITY: [facility] LOCATION: [facility] PHYSICIAN: [name], MD   OPERATIVE REPORT   DATE OF PROCEDURE: 04/26/20    PREOPERATIVE DIAGNOSIS:   Cubital tunnel syndrome left arm   POSTOPERATIVE DIAGNOSIS:   Same   PROCEDURE:   Decompression ulnar nerve left elbow   SURGEON: [name], M.D.   ASSISTANT: Annye Rusk, [facility]   ANESTHESIA:  General with regional   INTRAVENOUS FLUIDS:  Per anesthesia flow sheet.   ESTIMATED BLOOD LOSS:  Minimal.   COMPLICATIONS:  None.   SPECIMENS:  none   TOURNIQUET TIME:   * Missing tourniquet times found for documented tourniquets in log: [id number] *   DISPOSITION:  Stable to PACU.   INDICATIONS: Patient is a 49 year old female with a history of carpal tunnel release on her left side subsequently developed numbness and tingling to the ring and little finger with nerve conductions revealing a cubital tunnel syndrome.  This has not responded to conservative treatment.  She is elected undergo surgical decompression with possible transposition of the ulnar nerve left elbow.  Preperi-and postoperative course been discussed along with risks and complications.  She is aware that there is no guarantee to the surgery the possibility of infection recurrence injury to arteries nerves tendons incomplete relief of symptoms and dystrophy are discussed with her.  She is advised that you are attempting to halt the progression of the problem and hopefully allow the nerve the opportunity to improve.  The preoperative area the patient is seen the extremity marked by both patient and surgeon antibiotic given a axillary block was carried out without difficulty under the direction of the anesthesia department.  OPERATIVE COURSE: Patient is brought to the operating room placed in the supine position with the left arm free.  She was prepped using ChloraPrep a 3-minute dry time was  allowed and a timeout taken confirming patient procedure.  The limb was exsanguinated with an Esmarch bandage turn placed high in the arm was inflated to 250 mmHg.  A linear incision was made over the medial epicondyle left elbow carried down through subcutaneous tissue.  Bleeders were electrocauterized as necessary with bipolar.  The posterior branches of the medial antebrachial cutaneous nerve of the forearm were not seen in the wound.  Retractors were placed retracting the to skin flaps and Osborne's fascia was identified along with the ulnar nerve.  This was released in its posterior border.  The flexor carpi ulnaris was then addressed.  Retractors were placed retracting the subcutaneous tissue and skin from the flexor carpi ulnaris and retracted with a knee retractor.  The superficial fascia of the flexor carpi ulnaris was then incised with blunt scissors.  The to muscle bellies were then separated with blunt dissection.  A KMI guide for carpal tunnel release was then placed between the ulnar nerve and the deep fascia and this was released distally for approximately 8 cm.  A significant band was immediately identified distally.  This freed the entire nerve and was inspected and found to be intact over its entire course.  Attention was then directed proximally.  The subcutaneous tissue was then dissected from the brachial fascia proximally.  [facility] guide was then placed between the ulnar nerve and the brachial fascia.  Using the angled ENT scissors this was then used to release the brachial fascia for approximately the same distance proximally.  This allowed freedom of the ulnar nerve from any adhesions  and any compression.  The elbow was fully flexed and no subluxation was noted.  The wound was copiously irrigated with saline.  The Osborne fascia flap was left attached to the medial epicondyle and was sutured with 2-0 Vicryl sutures to the posterior skin flap.  The subcutaneous tissue was then closed with  interrupted 4-0 Vicryl and the skin with interrupted 5-0 nylon sutures.  A sterile compressive dressing was applied.  Deflation of the tourniquet all fingers immediately pink.  She was taken to the recovery room for observation in satisfactory condition.  The patient will be discharged home to return to the hand center of [facility] in 1 week on Tylenol ibuprofen for pain with with a prescription for Ultram for breakthrough.   Daryll Brod, MD Electronically signed, 04/26/20

## 2020-04-26 NOTE — Anesthesia Procedure Notes (Signed)
Anesthesia Regional Block: Axillary brachial plexus block   Pre-Anesthetic Checklist: ,, timeout performed, Correct Patient, Correct Site, Correct Laterality, Correct Procedure, Correct Position, site marked, Risks and benefits discussed,  Surgical consent,  Pre-op evaluation,  At surgeon's request and post-op pain management  Laterality: Left  Prep: chloraprep       Needles:  Injection technique: Single-shot  Needle Type: Stimulator Needle - 40     Needle Length: 5cm  Needle Gauge: 22     Additional Needles:   Procedures:, nerve stimulator,,,,,,,   Nerve Stimulator or Paresthesia:  Response: 0.4 mA,   Additional Responses:   Narrative:  Start time: 04/26/2020 8:52 AM End time: 04/26/2020 9:00 AM Injection made incrementally with aspirations every 5 mL.  Performed by: Personally  Anesthesiologist: Eilene Ghazi, MD  Additional Notes: Patient tolerated the procedure well without complications

## 2020-04-26 NOTE — H&P (Signed)
Brenda Serrano is an 49 y.o. female.   Chief Complaint: numbness ring and small left hand HPI: Brenda Serrano is a 49 yo female post carpal tunnel release left hand. She is now almost 4 months following her surgery.  She is now beginning to complain of some ulnar nerve symptoms. I started a Medrol Dosepak to see if this would help settle this for her. States that in the ring and small finger left hand continue to be numb.   She had nerve conductions done by Dr. 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 He was placed on a Medrol Dosepak prior to that time. She was also given a prescription for Celebrex and placed in a night extension splint. She states that the splint and anti-inflammatories have not helped she continues complain numbness and tingling in her ring and small finger left hand. States it is aggravating at the present time. She is not sleeping at night. She has a history of thyroid problems no history of diabetes arthritis gout. Family history is positive for thyroid problems negative for the remainder also.  Past Medical History:  Diagnosis Date  . Anxiety   . Arthritis   . Asthma    irritant induced with smoke  . Hypothyroidism   . Pneumonia   . Thyroid disease     Past Surgical History:  Procedure Laterality Date  . CARPAL TUNNEL RELEASE Right 06/30/2019   Procedure: CARPAL TUNNEL RELEASE;  Surgeon: [name], MD;  Location: [facility];  Service: Orthopedics;  Laterality: Right;  . CARPAL TUNNEL RELEASE Left 01/01/2020   Procedure: LEFT CARPAL TUNNEL RELEASE;  Surgeon: [name], MD;  Location: [facility];  Service: Orthopedics;  Laterality: Left;  IV REGIONAL FOREARM BLOCK  . CESAREAN SECTION    . STRABISMUS SURGERY     x2  . TOE AMPUTATION Right    small toe  . TUBAL LIGATION    . ULNAR COLLATERAL LIGAMENT REPAIR Right 06/30/2019   Procedure: REPAIR RECONSTRUCTION ULNAR COLLATERAL LIGAMENT RIGHT THUMB;  Surgeon: [name],  MD;  Location: [facility];  Service: Orthopedics;  Laterality: Right;  AXILLARY BLOCK    History reviewed. No pertinent family history. Social History:  reports that she has never smoked. She has never used smokeless tobacco. She reports previous alcohol use. She reports that she does not use drugs.  Allergies:  Allergies  Allergen Reactions  . Keflex [Cephalexin] Shortness Of Breath  . Amoxicillin Swelling    Hives and rash, swelling in face and arms   . Gabapentin Other (See Comments)    Edema in lower ext   . Nyquil Multi-Symptom [Pseudoeph-Doxylamine-Dm-Apap] Itching    And hives   . Trazodone Other (See Comments)    Pt states it was a long time ago, felt like throat was closing   . Ciprofloxacin Hives    No medications prior to admission.    No results found for this or any previous visit (from the past 48 hour(s)).   No results found.   Pertinent items are noted in HPI.  Height 5\' 2"  (1.575 m).  General appearance: alert, cooperative and appears stated age Head: Normocephalic, without obvious abnormality Neck: no JVD Resp: clear to auscultation bilaterally Cardio: regular rate and rhythm, S1, S2 normal, no murmur, click, rub or gallop GI: soft, non-tender; bowel sounds normal; no masses,  no organomegaly Extremities:  numbness ring and small left hand Pulses: 2+ and symmetric Skin: Skin color, texture, turgor  normal. No rashes or lesions Neurologic: Grossly normal Incision/Wound: na  Assessment/Plan Assessment:  1. 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 Entrapment of left ulnar nerve    Plan:  She would like to proceed to have this surgically released. Preperi-and postoperative course been discussed along with risk complications. She is aware there is no guarantee to the surgery the possibility of infection recurrence injury to arteries nerves tendons complete relief symptoms dystrophy.. She is advised this may require  transposition of the nerve subluxation or dislocation following the decompression. She is aware of this and would like to proceed and is scheduled for decompression possible transposition ulnar nerve left elbow as an outpatient under regional anesthesia.   Daryll Brod 04/26/2020, 5:33 AM

## 2020-04-26 NOTE — Discharge Instructions (Addendum)

## 2020-04-26 NOTE — Anesthesia Postprocedure Evaluation (Signed)
Anesthesia Post Note  Patient: Brenda Serrano  Procedure(s) Performed: ULNAR NERVE DECOMPRESSION   LEFT ELBOW (Left Elbow)     Patient location during evaluation: PACU Anesthesia Type: General Level of consciousness: awake and alert Pain management: pain level controlled Vital Signs Assessment: post-procedure vital signs reviewed and stable Respiratory status: spontaneous breathing, nonlabored ventilation, respiratory function stable and patient connected to nasal cannula oxygen Cardiovascular status: blood pressure returned to baseline and stable Postop Assessment: no apparent nausea or vomiting Anesthetic complications: no    Last Vitals:  Vitals:   04/26/20 1040 04/26/20 1132  BP:  (!) 128/98  Pulse: 70 70  Resp: 16 18  Temp:  36.6 C  SpO2: 94% 97%    Last Pain:  Vitals:   04/26/20 1132  TempSrc: Oral  PainSc: 4                  [name] S

## 2020-04-26 NOTE — Anesthesia Procedure Notes (Addendum)
Procedure Name: LMA Insertion Date/Time: 04/26/2020 9:15 AM Performed by: Glenwood Desanctis, CRNA Pre-anesthesia Checklist: Patient identified, Emergency Drugs available, Suction available, Patient being monitored and Timeout performed Patient Re-evaluated:Patient Re-evaluated prior to induction Oxygen Delivery Method: Circle system utilized Preoxygenation: Pre-oxygenation with 100% oxygen Induction Type: IV induction Ventilation: Mask ventilation without difficulty LMA: LMA inserted LMA Size: 4.0 Number of attempts: 1 Airway Equipment and Method: Bite block Placement Confirmation: positive ETCO2 Tube secured with: Tape Dental Injury: Teeth and Oropharynx as per pre-operative assessment

## 2020-04-26 NOTE — Brief Op Note (Signed)
04/26/2020  9:34 AM  PATIENT:  Francesco Sor  49 y.o. female  PRE-OPERATIVE DIAGNOSIS:  ULNAR NEUROPATHY LEFT ELBOW  POST-OPERATIVE DIAGNOSIS:  ULNAR NEUROPATHY LEFT ELBOW  PROCEDURE:  Procedure(s) with comments: ULNAR NERVE DECOMPRESSION   LEFT ELBOW (Left) - AXILLARY BLOCK  SURGEON:  Surgeon(s) and Role:    * [name], MD - Primary  PHYSICIAN ASSISTANT:   ASSISTANTS: R Dasnoit,PAC   ANESTHESIA:   regional and general  EBL:  28ml   BLOOD ADMINISTERED:none  DRAINS: none   LOCAL MEDICATIONS USED:  NONE  SPECIMEN:  No Specimen  DISPOSITION OF SPECIMEN:  N/A  COUNTS:  YES  TOURNIQUET:  * Missing tourniquet times found for documented tourniquets in log: [id number] *  DICTATION: .Dragon Dictation  PLAN OF CARE: Discharge to home after PACU  PATIENT DISPOSITION:  PACU - hemodynamically stable.

## 2020-04-26 NOTE — Progress Notes (Signed)
Assisted Dr. Rose with left, ultrasound guided, axillary block. Side rails up, monitors on throughout procedure. See vital signs in flow sheet. Tolerated Procedure well. 

## 2020-04-26 NOTE — Anesthesia Preprocedure Evaluation (Signed)
Anesthesia Evaluation  Patient identified by MRN, date of birth, ID band Patient awake    Reviewed: Allergy & Precautions, NPO status , Patient's Chart, lab work & pertinent test results  Airway Mallampati: II  TM Distance: >3 FB Neck ROM: Full    Dental no notable dental hx.    Pulmonary asthma ,    Pulmonary exam normal breath sounds clear to auscultation       Cardiovascular negative cardio ROS Normal cardiovascular exam Rhythm:Regular Rate:Normal     Neuro/Psych negative neurological ROS  negative psych ROS   GI/Hepatic negative GI ROS, Neg liver ROS,   Endo/Other  Hypothyroidism   Renal/GU negative Renal ROS  negative genitourinary   Musculoskeletal negative musculoskeletal ROS (+)   Abdominal   Peds negative pediatric ROS (+)  Hematology negative hematology ROS (+)   Anesthesia Other Findings   Reproductive/Obstetrics negative OB ROS                             Anesthesia Physical Anesthesia Plan  ASA: II  Anesthesia Plan: MAC   Post-op Pain Management:  Regional for Post-op pain   Induction: Intravenous  PONV Risk Score and Plan: 1 and Ondansetron  Airway Management Planned: Simple Face Mask  Additional Equipment:   Intra-op Plan:   Post-operative Plan:   Informed Consent: I have reviewed the patients History and Physical, chart, labs and discussed the procedure including the risks, benefits and alternatives for the proposed anesthesia with the patient or authorized representative who has indicated his/her understanding and acceptance.     Dental advisory given  Plan Discussed with: CRNA and Surgeon  Anesthesia Plan Comments:         Anesthesia Quick Evaluation

## 2020-04-27 ENCOUNTER — Encounter: Payer: Self-pay | Admitting: *Deleted

## 2020-04-29 NOTE — Addendum Note (Signed)
Addendum  created 04/29/20 0810 by Blocker, Jewel Baize, CRNA   Charge Capture section accepted

## 2020-04-29 NOTE — Addendum Note (Signed)
Addendum  created 04/29/20 1444 by Lance Coon, CRNA   Charge Capture section accepted

## 2020-05-25 ENCOUNTER — Other Ambulatory Visit: Payer: Self-pay

## 2020-05-25 ENCOUNTER — Ambulatory Visit (INDEPENDENT_AMBULATORY_CARE_PROVIDER_SITE_OTHER): Payer: Medicare Other | Admitting: Cardiology

## 2020-05-25 ENCOUNTER — Encounter: Payer: Self-pay | Admitting: *Deleted

## 2020-05-25 ENCOUNTER — Encounter: Payer: Self-pay | Admitting: Cardiology

## 2020-05-25 ENCOUNTER — Other Ambulatory Visit: Payer: Self-pay | Admitting: Cardiology

## 2020-05-25 VITALS — BP 124/72 | HR 88 | Ht 61.0 in | Wt 162.0 lb

## 2020-05-25 DIAGNOSIS — Z8249 Family history of ischemic heart disease and other diseases of the circulatory system: Secondary | ICD-10-CM

## 2020-05-25 DIAGNOSIS — I739 Peripheral vascular disease, unspecified: Secondary | ICD-10-CM

## 2020-05-25 DIAGNOSIS — R079 Chest pain, unspecified: Secondary | ICD-10-CM | POA: Diagnosis not present

## 2020-05-25 DIAGNOSIS — M79605 Pain in left leg: Secondary | ICD-10-CM | POA: Diagnosis not present

## 2020-05-25 DIAGNOSIS — M79604 Pain in right leg: Secondary | ICD-10-CM

## 2020-05-25 NOTE — Patient Instructions (Addendum)
Medication Instructions:   Your physician recommends that you continue on your current medications as directed. Please refer to the Current Medication list given to you today.  Labwork:  NONE  Testing/Procedures: Your physician has requested that you have a stress echocardiogram. For further information please visit [email or web address] Please follow instruction sheet as given.  Your physician has requested that you have a lower extremity arterial exercise duplex. During this test, exercise and ultrasound are used to evaluate arterial blood flow in the legs. Allow one hour for this exam. There are no restrictions or special instructions. Your physician has requested that you have an ankle brachial index (ABI). During this test an ultrasound and blood pressure cuff are used to evaluate the arteries that supply the arms and legs with blood. Allow thirty minutes for this exam. There are no restrictions or special instructions.  Follow-Up:  Your physician recommends that you schedule a follow-up appointment in: pending.  Any Other Special Instructions Will Be Listed Below (If Applicable).  If you need a refill on your cardiac medications before your next appointment, please call your pharmacy.

## 2020-05-25 NOTE — Progress Notes (Signed)
Cardiology Office Note  Date: 05/25/2020   ID: Brenda Serrano, DOB [birth year], MRN [id number]  PCP: Regular provider in [place] Cardiologist:  Rozann Lesches, MD Electrophysiologist:  None   Chief Complaint  Patient presents with  . Recurrent chest pain    History of Present Illness: Brenda Serrano is a 49 y.o. female self-referred for evaluation of recurrent chest pain.  I reviewed available records.  She is followed by [facility] (Dr. Shearon Stalls) in [place].  She states that she lives in [place] but also travels to this area, has been here more recently due to progressive health problems and hospitalization of her mother.  She reports a recurring history of chest pressure, also some left arm discomfort.  This is not necessarily brought on by any particular level of activity, question of whether stress has been contributing however based on our discussion today.  Her cardiac work-up over time has been reassuring including documentation of normal coronary arteries in 2016, negative stress test in 2018, no significant arrhythmias by cardiac monitor in 2018. I personally reviewed her ECG today which shows normal sinus rhythm.  She also describes pain in her calves and sometimes pain in her toes bilaterally with tingling and discoloration, recalls being told that she has Raynaud's phenomenon and saw a vascular specialist in the past, details are not available.  She has also had some perimenopausal symptoms, follows with a gynecologist in this area, in fact was just recently seen per chart review.   Past Medical History:  Diagnosis Date  . Anxiety   . Asthma   . Hashimoto's thyroiditis 1998    Past Surgical History:  Procedure Laterality Date  . CARPAL TUNNEL RELEASE Right 06/30/2019   Procedure: CARPAL TUNNEL RELEASE;  Surgeon: Daryll Brod, MD;  Location: [facility];  Service: Orthopedics;  Laterality: Right;  . CARPAL TUNNEL  RELEASE Left 01/01/2020   Procedure: LEFT CARPAL TUNNEL RELEASE;  Surgeon: Daryll Brod, MD;  Location: [facility];  Service: Orthopedics;  Laterality: Left;  IV REGIONAL FOREARM BLOCK  . CESAREAN SECTION    . STRABISMUS SURGERY     x2  . TOE AMPUTATION Right    small toe  . TUBAL LIGATION    . ULNAR COLLATERAL LIGAMENT REPAIR Right 06/30/2019   Procedure: REPAIR RECONSTRUCTION ULNAR COLLATERAL LIGAMENT RIGHT THUMB;  Surgeon: Daryll Brod, MD;  Location: [facility];  Service: Orthopedics;  Laterality: Right;  AXILLARY BLOCK  . ULNAR NERVE TRANSPOSITION Left 04/26/2020   Procedure: ULNAR NERVE DECOMPRESSION   LEFT ELBOW;  Surgeon: Daryll Brod, MD;  Location: [facility];  Service: Orthopedics;  Laterality: Left;  AXILLARY BLOCK    Current Outpatient Medications  Medication Sig Dispense Refill  . albuterol (ACCUNEB) 1.25 MG/3ML nebulizer solution Take 1 ampule by nebulization every 6 (six) hours as needed for wheezing.    Marland Kitchen albuterol (VENTOLIN HFA) 108 (90 Base) MCG/ACT inhaler Inhale into the lungs every 6 (six) hours as needed for wheezing or shortness of breath.    . ALPRAZolam (XANAX) 1 MG tablet Take 1 mg by mouth at bedtime as needed for anxiety.    . carisoprodol (SOMA) 250 MG tablet Take 1.5 tablets (375 mg total) by mouth 3 (three) times daily. 6 tablet 0  . levothyroxine (SYNTHROID) 125 MCG tablet Take 125 mcg by mouth daily before breakfast.     No current facility-administered medications for this visit.   Allergies:  Keflex [cephalexin], Amoxicillin, Gabapentin, Nyquil  multi-symptom [pseudoeph-doxylamine-dm-apap], Trazodone, and Ciprofloxacin   Social History: The patient  reports that she has never smoked. She has never used smokeless tobacco. She reports previous alcohol use.   Family History: The patient's family history includes Atrial fibrillation in her mother; Heart attack in her brother; Heart failure in her maternal grandfather  and mother; Hypertension in her mother; Hypothyroidism in her mother; Stroke in her maternal grandfather.   ROS:  No palpitations or syncope.  Physical Exam: VS:  BP 124/72   Pulse 88   Ht 5\' 1"  (1.549 m)   Wt 162 lb (73.5 kg)   BMI 30.61 kg/m , BMI Body mass index is 30.61 kg/m.  Wt Readings from Last 3 Encounters:  05/25/20 162 lb (73.5 kg)  04/26/20 162 lb 7.7 oz (73.7 kg)  01/01/20 162 lb 0.6 oz (73.5 kg)    General: Patient appears comfortable at rest. HEENT: Conjunctiva and lids normal, wearing a mask. Neck: Supple, no elevated JVP or carotid bruits, no thyromegaly. Lungs: Clear to auscultation, nonlabored breathing at rest. Cardiac: Regular rate and rhythm, no S3 or significant systolic murmur, no pericardial rub. Extremities: No pitting edema, no skin discoloration, DPs palpable. Skin: Warm and dry. Musculoskeletal: No kyphosis. Neuropsychiatric: Alert and oriented x3, affect grossly appropriate.  ECG:  No old tracing available for review today.  Recent Labwork:  February 2018: Cholesterol 157, HDL 54, triglycerides 55, LDL 89  Other Studies Reviewed Today:  Cardiac catheterization 05/18/2015 [facility]): Normal coronary arteries.  Carotid Dopplers 01/04/2017: Less than 50% ICA stenosis bilaterally.  GXT 01/11/2017: Maximum workload 10.1 METS, negative for ischemia.  Cardiac monitor 02/26/2017: Predominant rhythm is sinus with heart rate ranging from 51 bpm up to 153 bpm with average heart rate 84 bpm.  No significant arrhythmias.  Assessment and Plan:  1.  Recurrent chest pressure and left arm discomfort as noted above in a 49 year old woman with reported history of asthma as well as family history of heart disease in her brother.  Situational stress may also be a component of her symptoms.  Cardiac work-up performed in 53 from 2016-2018 has been reassuring as noted above.  She has not had a more recent ischemic assessment and  states that her symptoms are more frequent.  We will obtain an exercise echocardiogram for follow-up.  2.  Bilateral calf pain and possible Raynaud's phenomenon.  Will obtain lower extremity arterial Dopplers and ABIs to clarify presence of any PAD.  She does not smoke.  Medication Adjustments/Labs and Tests Ordered: Current medicines are reviewed at length with the patient today.  Concerns regarding medicines are outlined above.   Tests Ordered: Orders Placed This Encounter  Procedures  . EKG 12-Lead  . ECHOCARDIOGRAM STRESS TEST  . VAS 02-14-1978 LOWER EXT ART SEG MULTI (SEGMENTALS & LE RAYNAUDS)    Medication Changes: No orders of the defined types were placed in this encounter.   Disposition:  Follow up test results.  Signed, [place], MD, [facility] 05/25/2020 9:48 AM    [facility] [address] [place], [place], [place] Phone: [phone]; Fax: [phone]

## 2020-06-08 ENCOUNTER — Ambulatory Visit (HOSPITAL_COMMUNITY): Admission: RE | Admit: 2020-06-08 | Payer: Medicare Other | Source: Ambulatory Visit

## 2020-06-21 ENCOUNTER — Ambulatory Visit (INDEPENDENT_AMBULATORY_CARE_PROVIDER_SITE_OTHER): Payer: Medicare Other

## 2020-06-21 DIAGNOSIS — I739 Peripheral vascular disease, unspecified: Secondary | ICD-10-CM

## 2020-06-23 ENCOUNTER — Telehealth: Payer: Self-pay | Admitting: *Deleted

## 2020-06-23 NOTE — Telephone Encounter (Signed)
-----   Message from Jonelle Sidle, MD sent at 06/22/2020  4:53 PM EDT ----- Results reviewed.  Please let her know that the lower extremity arterial studies were normal and do not suggest obstructive PAD as cause of her symptoms.

## 2020-06-28 ENCOUNTER — Encounter: Payer: Self-pay | Admitting: *Deleted

## 2020-06-28 NOTE — Telephone Encounter (Signed)
This encounter was created in error - please disregard.

## 2020-06-28 NOTE — Telephone Encounter (Signed)
-----   Message from Samuel G McDowell, MD sent at 06/22/2020  4:53 PM EDT ----- Results reviewed.  Please let her know that the lower extremity arterial studies were normal and do not suggest obstructive PAD as cause of her symptoms. 

## 2020-06-29 NOTE — Telephone Encounter (Signed)
Patient informed. 

## 2020-07-27 ENCOUNTER — Ambulatory Visit (HOSPITAL_COMMUNITY): Admission: RE | Admit: 2020-07-27 | Payer: Medicare Other | Source: Ambulatory Visit

## 2020-11-03 ENCOUNTER — Other Ambulatory Visit: Payer: Self-pay | Admitting: Orthopedic Surgery

## 2020-12-03 HISTORY — PX: ANKLE SURGERY: SHX546

## 2020-12-15 ENCOUNTER — Other Ambulatory Visit: Payer: Medicare Other

## 2020-12-21 ENCOUNTER — Encounter: Payer: Self-pay | Admitting: Cardiology

## 2020-12-22 ENCOUNTER — Other Ambulatory Visit: Payer: Self-pay

## 2021-01-12 ENCOUNTER — Ambulatory Visit (HOSPITAL_BASED_OUTPATIENT_CLINIC_OR_DEPARTMENT_OTHER): Admit: 2021-01-12 | Payer: Medicare Other | Admitting: Orthopedic Surgery

## 2021-01-12 ENCOUNTER — Encounter (HOSPITAL_BASED_OUTPATIENT_CLINIC_OR_DEPARTMENT_OTHER): Payer: Self-pay

## 2021-01-12 SURGERY — ULNAR NERVE DECOMPRESSION/TRANSPOSITION
Anesthesia: Regional | Laterality: Right

## 2022-02-05 ENCOUNTER — Emergency Department (HOSPITAL_COMMUNITY): Payer: Medicare Other

## 2022-02-05 ENCOUNTER — Other Ambulatory Visit: Payer: Self-pay

## 2022-02-05 ENCOUNTER — Emergency Department (HOSPITAL_COMMUNITY)
Admission: EM | Admit: 2022-02-05 | Discharge: 2022-02-05 | Disposition: A | Discharge status: Home / Self Care | Payer: Medicare Other | Attending: Emergency Medicine | Admitting: Emergency Medicine

## 2022-02-05 ENCOUNTER — Encounter (HOSPITAL_COMMUNITY): Payer: Self-pay | Admitting: Emergency Medicine

## 2022-02-05 DIAGNOSIS — R0602 Shortness of breath: Secondary | ICD-10-CM | POA: Insufficient documentation

## 2022-02-05 DIAGNOSIS — J45909 Unspecified asthma, uncomplicated: Secondary | ICD-10-CM | POA: Insufficient documentation

## 2022-02-05 DIAGNOSIS — G43009 Migraine without aura, not intractable, without status migrainosus: Secondary | ICD-10-CM

## 2022-02-05 DIAGNOSIS — G43809 Other migraine, not intractable, without status migrainosus: Secondary | ICD-10-CM | POA: Insufficient documentation

## 2022-02-05 DIAGNOSIS — R531 Weakness: Secondary | ICD-10-CM | POA: Diagnosis not present

## 2022-02-05 DIAGNOSIS — R519 Headache, unspecified: Secondary | ICD-10-CM | POA: Diagnosis present

## 2022-02-05 DIAGNOSIS — Z79899 Other long term (current) drug therapy: Secondary | ICD-10-CM | POA: Insufficient documentation

## 2022-02-05 DIAGNOSIS — R079 Chest pain, unspecified: Secondary | ICD-10-CM | POA: Insufficient documentation

## 2022-02-05 LAB — BASIC METABOLIC PANEL
Anion gap: 12 (ref 5–15)
BUN: 14 mg/dL (ref 6–20)
CO2: 18 mmol/L — ABNORMAL LOW (ref 22–32)
Calcium: 9.1 mg/dL (ref 8.9–10.3)
Chloride: 108 mmol/L (ref 98–111)
Creatinine, Ser: 0.94 mg/dL (ref 0.44–1.00)
GFR, Estimated: >60 mL/min (ref >60)
Glucose, Bld: 106 mg/dL — ABNORMAL HIGH (ref 70–99)
Potassium: 3.8 mmol/L (ref 3.5–5.1)
Sodium: 138 mmol/L (ref 135–145)

## 2022-02-05 LAB — CBC
HCT: 46 % (ref 36.0–46.0)
Hemoglobin: 15.4 g/dL — ABNORMAL HIGH (ref 12.0–15.0)
MCH: 28.7 pg (ref 26.0–34.0)
MCHC: 33.5 g/dL (ref 30.0–36.0)
MCV: 85.7 fL (ref 80.0–100.0)
Platelets: 363 10*3/uL (ref 150–400)
RBC: 5.37 MIL/uL — ABNORMAL HIGH (ref 3.87–5.11)
RDW: 12.4 % (ref 11.5–15.5)
WBC: 6.1 10*3/uL (ref 4.0–10.5)
nRBC: 0 % (ref 0.0–0.2)

## 2022-02-05 LAB — TROPONIN I (HIGH SENSITIVITY)
Troponin I (High Sensitivity): 4 ng/L (ref <18)
Troponin I (High Sensitivity): 5 ng/L (ref <18)

## 2022-02-05 MED ORDER — MAGNESIUM SULFATE IN D5W 1-5 GM/100ML-% IV SOLN
1.0000 g | Freq: Once | INTRAVENOUS | Status: AC
  Administered 2022-02-05: 1 g via INTRAVENOUS
  Filled 2022-02-05: qty 100

## 2022-02-05 MED ORDER — SODIUM CHLORIDE 0.9 % IV BOLUS
1000.0000 mL | Freq: Once | INTRAVENOUS | Status: AC
  Administered 2022-02-05: 1000 mL via INTRAVENOUS

## 2022-02-05 MED ORDER — PANTOPRAZOLE SODIUM 40 MG IV SOLR
40.0000 mg | Freq: Once | INTRAVENOUS | Status: AC
  Administered 2022-02-05: 40 mg via INTRAVENOUS
  Filled 2022-02-05: qty 10

## 2022-02-05 MED ORDER — KETOROLAC TROMETHAMINE 15 MG/ML IJ SOLN
15.0000 mg | Freq: Once | INTRAMUSCULAR | Status: AC
  Administered 2022-02-05: 15 mg via INTRAVENOUS
  Filled 2022-02-05: qty 1

## 2022-02-05 MED ORDER — DIPHENHYDRAMINE HCL 50 MG/ML IJ SOLN
12.5000 mg | Freq: Once | INTRAMUSCULAR | Status: AC
  Administered 2022-02-05: 12.5 mg via INTRAVENOUS
  Filled 2022-02-05: qty 1

## 2022-02-05 MED ORDER — DROPERIDOL 2.5 MG/ML IJ SOLN
2.5000 mg | Freq: Once | INTRAMUSCULAR | Status: AC
  Administered 2022-02-05: 2.5 mg via INTRAVENOUS
  Filled 2022-02-05: qty 2

## 2022-02-05 NOTE — ED Notes (Signed)
Pt called out after receiving the droperidol stating she felt worse by making her feel very anxious and agitated. ?

## 2022-02-05 NOTE — ED Notes (Signed)
Discharge instructions reviewed and explained pt verbalized understanding. ?

## 2022-02-05 NOTE — Discharge Instructions (Signed)
As discussed, your evaluation today has been largely reassuring.  But, it is important that you monitor your condition carefully, and do not hesitate to return to the ED if you develop new, or concerning changes in your condition. ? ?Otherwise, please follow-up with your physician for appropriate ongoing care. ? ?

## 2022-02-05 NOTE — ED Provider Notes (Signed)
[facility] EMERGENCY DEPARTMENT Provider Note   CSN: [id number] Arrival date & time: 02/05/22  1352     History  Chief Complaint  Patient presents with   Weakness    Brenda Serrano is a 51 y.o. female who presents the emergency department with chest pain and headache.  Patient states that she has had persistent headache with left face numbness and left arm weakness for about 2 weeks.  She originally presented to an outside facility and was admitted for a TIA versus complex migraine work-up.  She states that she did not have any relief of her headache during her hospitalization.  Last night she started developing chest pain and shortness of breath, worse with laying down.  No alleviating factors.  No leg pain or swelling.   Weakness Associated symptoms: chest pain, headaches and shortness of breath   Associated symptoms: no abdominal pain, no cough, no nausea, no seizures and no vomiting       Home Medications Prior to Admission medications   Medication Sig Start Date End Date Taking? Authorizing Provider  albuterol (ACCUNEB) 1.25 MG/3ML nebulizer solution Take 1 ampule by nebulization every 6 (six) hours as needed for wheezing.    [provider]  albuterol (VENTOLIN HFA) 108 (90 Base) MCG/ACT inhaler Inhale into the lungs every 6 (six) hours as needed for wheezing or shortness of breath.    [provider]  ALPRAZolam Duanne Moron) 1 MG tablet Take 1 mg by mouth at bedtime as needed for anxiety.    Joline Salt, RN  carisoprodol (SOMA) 250 MG tablet Take 1.5 tablets (375 mg total) by mouth 3 (three) times daily. 03/17/18   Hedges, Dellis Filbert, PA-C  levothyroxine (SYNTHROID) 125 MCG tablet Take 125 mcg by mouth daily before breakfast.    Joline Salt, RN      Allergies    Keflex [cephalexin], Amoxicillin, Gabapentin, Nyquil multi-symptom [pseudoeph-doxylamine-dm-apap], Trazodone, and Ciprofloxacin    Review of Systems   Review of Systems   Eyes:  Negative for photophobia.  Respiratory:  Positive for shortness of breath. Negative for cough.   Cardiovascular:  Positive for chest pain.  Gastrointestinal:  Negative for abdominal pain, nausea and vomiting.  Neurological:  Positive for weakness and headaches. Negative for seizures and syncope.       Decreased sensation in the left face and left arm  All other systems reviewed and are negative.  Physical Exam Updated Vital Signs BP (!) 141/99 (BP Location: Left Arm)    Pulse 81    Temp 98.2 F (36.8 C) (Oral)    Resp 14    Ht 5\' 2"  (1.575 m)    Wt 77.1 kg    SpO2 100%    BMI 31.09 kg/m  Physical Exam Vitals and nursing note reviewed.  Constitutional:      Appearance: Normal appearance.  HENT:     Head: Normocephalic and atraumatic.  Eyes:     Conjunctiva/sclera: Conjunctivae normal.  Cardiovascular:     Rate and Rhythm: Normal rate and regular rhythm.  Pulmonary:     Effort: Pulmonary effort is normal. No respiratory distress.     Breath sounds: Normal breath sounds.  Abdominal:     General: There is no distension.     Palpations: Abdomen is soft.     Tenderness: There is no abdominal tenderness.  Skin:    General: Skin is warm and dry.  Neurological:     General: No focal deficit present.  Mental Status: She is alert.     Comments: Neuro: Speech is clear, able to follow commands. CN III-XII intact grossly intact. PERRLA. EOMI. decree sensation in the left upper extremity compared to right.  Str 5/5 all extremities.  No facial droop.    ED Results / Procedures / Treatments   Labs (all labs ordered are listed, but only abnormal results are displayed) Labs Reviewed  BASIC METABOLIC PANEL - Abnormal; Notable for the following components:      Result Value   CO2 18 (*)    Glucose, Bld 106 (*)    All other components within normal limits  CBC - Abnormal; Notable for the following components:   RBC 5.37 (*)    Hemoglobin 15.4 (*)    All other components within  normal limits  TROPONIN I (HIGH SENSITIVITY)  TROPONIN I (HIGH SENSITIVITY)    EKG None  Radiology DG Chest 2 View  Result Date: 02/05/2022 CLINICAL DATA:  Chest pain EXAM: CHEST - 2 VIEW COMPARISON:  01/10/2021 FINDINGS: The heart size and mediastinal contours are within normal limits. Both lungs are clear. No pleural effusion or pneumothorax. The visualized skeletal structures are unremarkable. IMPRESSION: No acute process in the chest. Electronically Signed   By: Macy Mis M.D.   On: 02/05/2022 14:41    Procedures Procedures    Medications Ordered in ED Medications  magnesium sulfate IVPB 1 g 100 mL (has no administration in time range)  sodium chloride 0.9 % bolus 1,000 mL (1,000 mLs Intravenous New Bag/Given 02/05/22 1817)  ketorolac (TORADOL) 15 MG/ML injection 15 mg (15 mg Intravenous Given 02/05/22 1814)  diphenhydrAMINE (BENADRYL) injection 12.5 mg (12.5 mg Intravenous Given 02/05/22 1813)  pantoprazole (PROTONIX) injection 40 mg (40 mg Intravenous Given 02/05/22 1815)    ED Course/ Medical Decision Making/ A&P                           Medical Decision Making Amount and/or Complexity of Data Reviewed Labs: ordered. Radiology: ordered.   This patient presents to the ED for concern of headache and chest pain, this involves an extensive number of treatment options, and is a complaint that carries with it a high risk of complications and morbidity. The emergent differential diagnosis prior to evaluation includes, but is not limited to,   Meningitis, temporal arteritis, glaucoma, cerebral ischemia, carotid/vertebral dissection, intracranial tumor, Venous sinus thrombosis, acute or chronic subdural hemorrhage, Migraine, Cluster headache, Hypertension, Caffeine, alcohol, or drug withdrawal, Pseudotumor cerebri, Arteriovenous malformation, Head injury, Tension headache, Sinusitis, Cervical arthritis, Dental abscess, Temporomandibular joint syndrome, Trigeminal neuralgia,   Acute  coronary syndrome, pericarditis, aortic dissection, pulmonary embolism, tension pneumothorax, and esophageal rupture, cardiomyopathy, pneumonia, gastroesophageal reflux disease (GERD), peptic ulcer disease, biliary disease, pancreatitis, costochondritis, subacromial bursitis, anxiety or panic attack.  This is not an exhaustive differential.   Past Medical History / Co-morbidities / Social History: Asthma, anxiety, Hashimoto's thyroiditis  Additional history: Additional history obtained from chart review. External records from outside source obtained and reviewed including prior hospitalization in outside facility and imaging results to include CT head, CTA head and neck, and MRI brain.  Results included no acute stroke, No large vessel occlusion, aneurysm or dissection.  MRI showed no acute intracranial abnormality.  There was a 3.5 mm signal abnormality in the right pons most consistent with benign etiology, there is recommendation for 3 to 82-month follow-up for stability.  Physical Exam: Physical exam performed. The pertinent findings include: Patient  is afebrile, tachycardic to 103, Normal oxygen saturation.  Her heart has regular rate and rhythm on auscultation, and lung sounds are clear.  She does not appear to be in any acute distress.  Abdomen is soft, nontender.  Minimally decreased sensation on the left upper extremity compared to the right, otherwise normal neurologic exam.  Normal distal extremity pulses.  Lab Tests: I ordered, and personally interpreted labs.  The pertinent results include: No leukocytosis, Hemoglobin stable, electrolytes grossly within normal limits.  Initial troponin of 4, troponin of 5.   Imaging Studies: I ordered imaging studies including chest x-ray. I independently visualized and interpreted imaging which showed no acute cardiopulmonary abnormalities. I agree with the radiologist interpretation.   Cardiac Monitoring:  The patient was maintained on a cardiac  monitor.  My attending physician viewed and interpreted the cardiac monitored which showed an underlying rhythm of: sinus rhythm with PVC's. I agree with this interpretation.    Medications: I ordered medication including migraine cocktail and Protonix for headache and possible reflux. Reevaluation of the patient after these medicines showed that the patient stayed the same. I have reviewed the patients home medicines and have made adjustments as needed.  Disposition: After consideration of the diagnostic results and the patients response to treatment, I feel that patient is likely not requiring admission or inpatient treatment for her symptoms.Chest pain is not likely of cardiac or pulmonary etiology d/t presentation, PERC negative, VSS, no tracheal deviation, no JVD or new murmur, RRR, breath sounds equal bilaterally, EKG without acute abnormalities, negative troponin, and negative CXR.   Patient discussed and care transferred to attending physician Dr. Vanita Panda at shift change. See his note for disposition.   Final Clinical Impression(s) / ED Diagnoses Final diagnoses:  Atypical migraine  Nonspecific chest pain    Rx / DC Orders ED Discharge Orders     None      Portions of this report may have been transcribed using voice recognition software. Every effort was made to ensure accuracy; however, inadvertent computerized transcription errors may be present.    Estill Cotta 02/05/22 1916    Carmin Muskrat, MD 02/05/22 2303

## 2022-02-05 NOTE — ED Triage Notes (Signed)
Patient here with complaint of headaches, numbness, weakness for two weeks. Patient states she was admitted to [facility] for a TIA last week and was started on 81mg  apirin daily. No facial droop, no dysarthria, no arm drift, patient reports less sensation on left versus right on face and arms, equal in legs. Patient is alert, oriented, ambulatory, and in no apparent distress at this time. ?

## 2022-05-24 ENCOUNTER — Other Ambulatory Visit: Payer: Self-pay | Admitting: Orthopedic Surgery

## 2022-05-31 ENCOUNTER — Encounter (HOSPITAL_BASED_OUTPATIENT_CLINIC_OR_DEPARTMENT_OTHER): Payer: Self-pay | Admitting: Orthopedic Surgery

## 2022-05-31 ENCOUNTER — Other Ambulatory Visit: Payer: Self-pay

## 2022-06-14 ENCOUNTER — Encounter (HOSPITAL_BASED_OUTPATIENT_CLINIC_OR_DEPARTMENT_OTHER)
Admission: RE | Disposition: A | Discharge status: Home / Self Care | Payer: Self-pay | Source: Home / Self Care | Attending: Orthopedic Surgery

## 2022-06-14 ENCOUNTER — Ambulatory Visit (HOSPITAL_BASED_OUTPATIENT_CLINIC_OR_DEPARTMENT_OTHER)
Admission: RE | Admit: 2022-06-14 | Discharge: 2022-06-14 | Disposition: A | Discharge status: Home / Self Care | Payer: Medicare Other | Attending: Orthopedic Surgery | Admitting: Orthopedic Surgery

## 2022-06-14 ENCOUNTER — Ambulatory Visit (HOSPITAL_BASED_OUTPATIENT_CLINIC_OR_DEPARTMENT_OTHER): Payer: Medicare Other | Admitting: Anesthesiology

## 2022-06-14 ENCOUNTER — Encounter (HOSPITAL_BASED_OUTPATIENT_CLINIC_OR_DEPARTMENT_OTHER): Payer: Self-pay | Admitting: Orthopedic Surgery

## 2022-06-14 ENCOUNTER — Other Ambulatory Visit: Payer: Self-pay

## 2022-06-14 DIAGNOSIS — J45909 Unspecified asthma, uncomplicated: Secondary | ICD-10-CM | POA: Diagnosis not present

## 2022-06-14 DIAGNOSIS — G5621 Lesion of ulnar nerve, right upper limb: Secondary | ICD-10-CM | POA: Diagnosis present

## 2022-06-14 DIAGNOSIS — F419 Anxiety disorder, unspecified: Secondary | ICD-10-CM | POA: Diagnosis not present

## 2022-06-14 DIAGNOSIS — Z01818 Encounter for other preprocedural examination: Secondary | ICD-10-CM

## 2022-06-14 DIAGNOSIS — E039 Hypothyroidism, unspecified: Secondary | ICD-10-CM | POA: Diagnosis not present

## 2022-06-14 HISTORY — PX: ULNAR NERVE TRANSPOSITION: SHX2595

## 2022-06-14 HISTORY — DX: Headache, unspecified: R51.9

## 2022-06-14 SURGERY — ULNAR NERVE DECOMPRESSION/TRANSPOSITION
Anesthesia: Monitor Anesthesia Care | Site: Arm Upper | Laterality: Right

## 2022-06-14 MED ORDER — OXYCODONE HCL 5 MG PO TABS: 5.0000 mg | ORAL_TABLET | Freq: Once | ORAL | Status: DC | PRN

## 2022-06-14 MED ORDER — ONDANSETRON HCL 4 MG/2ML IJ SOLN
INTRAMUSCULAR | Status: DC | PRN
  Administered 2022-06-14: 4 mg via INTRAVENOUS

## 2022-06-14 MED ORDER — LACTATED RINGERS IV SOLN: INTRAVENOUS | Status: DC

## 2022-06-14 MED ORDER — SCOPOLAMINE 1 MG/3DAYS TD PT72
MEDICATED_PATCH | TRANSDERMAL | Status: DC | PRN
  Administered 2022-06-14: 1 via TRANSDERMAL

## 2022-06-14 MED ORDER — OXYCODONE HCL 5 MG/5ML PO SOLN: 5.0000 mg | Freq: Once | ORAL | Status: DC | PRN

## 2022-06-14 MED ORDER — FENTANYL CITRATE (PF) 100 MCG/2ML IJ SOLN: 25.0000 ug | INTRAMUSCULAR | Status: DC | PRN

## 2022-06-14 MED ORDER — ONDANSETRON HCL 4 MG/2ML IJ SOLN
INTRAMUSCULAR | Status: AC
  Filled 2022-06-14: qty 2

## 2022-06-14 MED ORDER — FENTANYL CITRATE (PF) 100 MCG/2ML IJ SOLN
INTRAMUSCULAR | Status: AC
  Filled 2022-06-14: qty 2

## 2022-06-14 MED ORDER — FENTANYL CITRATE (PF) 100 MCG/2ML IJ SOLN
100.0000 ug | Freq: Once | INTRAMUSCULAR | Status: AC
  Administered 2022-06-14: 100 ug via INTRAVENOUS

## 2022-06-14 MED ORDER — AMISULPRIDE (ANTIEMETIC) 5 MG/2ML IV SOLN: 10.0000 mg | Freq: Once | INTRAVENOUS | Status: DC | PRN

## 2022-06-14 MED ORDER — HYDROCODONE-ACETAMINOPHEN 5-325 MG PO TABS: ORAL_TABLET | ORAL | 0 refills | Status: AC

## 2022-06-14 MED ORDER — CEFAZOLIN SODIUM-DEXTROSE 2-4 GM/100ML-% IV SOLN
INTRAVENOUS | Status: AC
Start: 2022-06-14 — End: ?
  Filled 2022-06-14: qty 100

## 2022-06-14 MED ORDER — MIDAZOLAM HCL 2 MG/2ML IJ SOLN
INTRAMUSCULAR | Status: AC
  Filled 2022-06-14: qty 2

## 2022-06-14 MED ORDER — ROCURONIUM BROMIDE 10 MG/ML (PF) SYRINGE
PREFILLED_SYRINGE | INTRAVENOUS | Status: AC
  Filled 2022-06-14: qty 10

## 2022-06-14 MED ORDER — ACETAMINOPHEN 500 MG PO TABS
1000.0000 mg | ORAL_TABLET | Freq: Once | ORAL | Status: AC
  Administered 2022-06-14: 1000 mg via ORAL

## 2022-06-14 MED ORDER — BUPIVACAINE HCL (PF) 0.25 % IJ SOLN
INTRAMUSCULAR | Status: AC
  Filled 2022-06-14: qty 30

## 2022-06-14 MED ORDER — PROPOFOL 500 MG/50ML IV EMUL
INTRAVENOUS | Status: DC | PRN
  Administered 2022-06-14: 25 ug/kg/min via INTRAVENOUS

## 2022-06-14 MED ORDER — LIDOCAINE 2% (20 MG/ML) 5 ML SYRINGE
INTRAMUSCULAR | Status: AC
  Filled 2022-06-14: qty 5

## 2022-06-14 MED ORDER — VANCOMYCIN HCL IN DEXTROSE 1-5 GM/200ML-% IV SOLN
1000.0000 mg | INTRAVENOUS | Status: AC
  Administered 2022-06-14: 1000 mg via INTRAVENOUS

## 2022-06-14 MED ORDER — MIDAZOLAM HCL 2 MG/2ML IJ SOLN
2.0000 mg | Freq: Once | INTRAMUSCULAR | Status: AC
  Administered 2022-06-14: 2 mg via INTRAVENOUS

## 2022-06-14 MED ORDER — SCOPOLAMINE 1 MG/3DAYS TD PT72
MEDICATED_PATCH | TRANSDERMAL | Status: AC
Start: 2022-06-14 — End: ?
  Filled 2022-06-14: qty 1

## 2022-06-14 MED ORDER — FENTANYL CITRATE (PF) 100 MCG/2ML IJ SOLN
INTRAMUSCULAR | Status: DC | PRN
  Administered 2022-06-14: 50 ug via INTRAVENOUS

## 2022-06-14 MED ORDER — MIDAZOLAM HCL 5 MG/5ML IJ SOLN
INTRAMUSCULAR | Status: DC | PRN
  Administered 2022-06-14: 2 mg via INTRAVENOUS

## 2022-06-14 MED ORDER — ROPIVACAINE HCL 5 MG/ML IJ SOLN
INTRAMUSCULAR | Status: DC | PRN
  Administered 2022-06-14: 25 mL via EPIDURAL

## 2022-06-14 MED ORDER — 0.9 % SODIUM CHLORIDE (POUR BTL) OPTIME
TOPICAL | Status: DC | PRN
  Administered 2022-06-14: 120 mL

## 2022-06-14 MED ORDER — VANCOMYCIN HCL IN DEXTROSE 1-5 GM/200ML-% IV SOLN
INTRAVENOUS | Status: AC
  Filled 2022-06-14: qty 200

## 2022-06-14 MED ORDER — ACETAMINOPHEN 500 MG PO TABS
ORAL_TABLET | ORAL | Status: AC
  Filled 2022-06-14: qty 1

## 2022-06-14 MED ORDER — ACETAMINOPHEN 500 MG PO TABS
ORAL_TABLET | ORAL | Status: AC
  Filled 2022-06-14: qty 2

## 2022-06-14 MED ORDER — ONDANSETRON HCL 4 MG/2ML IJ SOLN: 4.0000 mg | Freq: Once | INTRAMUSCULAR | Status: DC | PRN

## 2022-06-14 SURGICAL SUPPLY — 58 items
APL PRP STRL LF DISP 70% ISPRP (MISCELLANEOUS) ×1
BLADE MINI RND TIP GREEN BEAV (BLADE) IMPLANT
BLADE SURG 15 STRL LF DISP TIS (BLADE) ×2 IMPLANT
BLADE SURG 15 STRL SS (BLADE) ×4
BNDG CMPR 9X4 STRL LF SNTH (GAUZE/BANDAGES/DRESSINGS) ×1
BNDG ELASTIC 3X5.8 VLCR STR LF (GAUZE/BANDAGES/DRESSINGS) ×4 IMPLANT
BNDG ELASTIC 4X5.8 VLCR STR LF (GAUZE/BANDAGES/DRESSINGS) ×2 IMPLANT
BNDG ESMARK 4X9 LF (GAUZE/BANDAGES/DRESSINGS) ×2 IMPLANT
BNDG GAUZE DERMACEA FLUFF (GAUZE/BANDAGES/DRESSINGS) ×1
BNDG GAUZE DERMACEA FLUFF 4 (GAUZE/BANDAGES/DRESSINGS) ×1 IMPLANT
BNDG GZE DERMACEA 4 6PLY (GAUZE/BANDAGES/DRESSINGS) ×1
CHLORAPREP W/TINT 26 (MISCELLANEOUS) ×2 IMPLANT
CORD BIPOLAR FORCEPS 12FT (ELECTRODE) ×2 IMPLANT
COVER BACK TABLE 60X90IN (DRAPES) ×2 IMPLANT
COVER MAYO STAND STRL (DRAPES) ×2 IMPLANT
CUFF TOURN SGL QUICK 18X3 (MISCELLANEOUS) ×2 IMPLANT
CUFF TOURN SGL QUICK 18X4 (TOURNIQUET CUFF) IMPLANT
DRAPE EXTREMITY T 121X128X90 (DISPOSABLE) ×2 IMPLANT
DRAPE SURG 17X23 STRL (DRAPES) ×2 IMPLANT
DRSG PAD ABDOMINAL 8X10 ST (GAUZE/BANDAGES/DRESSINGS) ×2 IMPLANT
GAUZE 4X4 16PLY ~~LOC~~+RFID DBL (SPONGE) IMPLANT
GAUZE SPONGE 4X4 12PLY STRL (GAUZE/BANDAGES/DRESSINGS) ×2 IMPLANT
GAUZE XEROFORM 1X8 LF (GAUZE/BANDAGES/DRESSINGS) ×2 IMPLANT
GLOVE BIO SURGEON STRL SZ7.5 (GLOVE) ×2 IMPLANT
GLOVE BIOGEL PI IND STRL 8 (GLOVE) ×1 IMPLANT
GLOVE BIOGEL PI IND STRL 8.5 (GLOVE) ×1 IMPLANT
GLOVE BIOGEL PI INDICATOR 8 (GLOVE) ×1
GLOVE BIOGEL PI INDICATOR 8.5 (GLOVE) ×1
GLOVE SURG ORTHO 8.0 STRL STRW (GLOVE) ×2 IMPLANT
GOWN STRL REUS W/ TWL LRG LVL3 (GOWN DISPOSABLE) ×1 IMPLANT
GOWN STRL REUS W/TWL LRG LVL3 (GOWN DISPOSABLE) ×2
GOWN STRL REUS W/TWL XL LVL3 (GOWN DISPOSABLE) ×4 IMPLANT
NDL HYPO 25X1 1.5 SAFETY (NEEDLE) IMPLANT
NEEDLE HYPO 25X1 1.5 SAFETY (NEEDLE) IMPLANT
NS IRRIG 1000ML POUR BTL (IV SOLUTION) ×2 IMPLANT
PACK BASIN DAY SURGERY FS (CUSTOM PROCEDURE TRAY) ×2 IMPLANT
PAD CAST 3X4 CTTN HI CHSV (CAST SUPPLIES) ×1 IMPLANT
PAD CAST 4YDX4 CTTN HI CHSV (CAST SUPPLIES) ×1 IMPLANT
PADDING CAST ABS 4INX4YD NS (CAST SUPPLIES) ×1
PADDING CAST ABS COTTON 4X4 ST (CAST SUPPLIES) ×1 IMPLANT
PADDING CAST COTTON 3X4 STRL (CAST SUPPLIES) ×2
PADDING CAST COTTON 4X4 STRL (CAST SUPPLIES) ×2
SLEEVE SCD COMPRESS KNEE MED (STOCKING) ×2 IMPLANT
SLING ARM FOAM STRAP MED (SOFTGOODS) ×1 IMPLANT
SPIKE FLUID TRANSFER (MISCELLANEOUS) IMPLANT
SPLINT FAST PLASTER 5X30 (CAST SUPPLIES)
SPLINT PLASTER CAST FAST 5X30 (CAST SUPPLIES) IMPLANT
SPLINT PLASTER CAST XFAST 3X15 (CAST SUPPLIES) IMPLANT
SPLINT PLASTER XTRA FASTSET 3X (CAST SUPPLIES)
STOCKINETTE 4X48 STRL (DRAPES) ×2 IMPLANT
SUT ETHILON 4 0 PS 2 18 (SUTURE) ×2 IMPLANT
SUT VIC AB 2-0 SH 27 (SUTURE) ×2
SUT VIC AB 2-0 SH 27XBRD (SUTURE) ×1 IMPLANT
SUT VICRYL 4-0 PS2 18IN ABS (SUTURE) IMPLANT
SYR BULB EAR ULCER 3OZ GRN STR (SYRINGE) ×2 IMPLANT
SYR CONTROL 10ML LL (SYRINGE) IMPLANT
TOWEL GREEN STERILE FF (TOWEL DISPOSABLE) ×2 IMPLANT
UNDERPAD 30X36 HEAVY ABSORB (UNDERPADS AND DIAPERS) ×2 IMPLANT

## 2022-06-14 NOTE — Anesthesia Postprocedure Evaluation (Signed)
Anesthesia Post Note  Patient: Brenda Serrano  Procedure(s) Performed: DECOMPRESSION RIGHT ULNAR NERVE (Right: Arm Upper)     Patient location during evaluation: PACU Anesthesia Type: Regional and MAC Level of consciousness: awake and alert Pain management: pain level controlled Vital Signs Assessment: post-procedure vital signs reviewed and stable Respiratory status: spontaneous breathing, nonlabored ventilation and respiratory function stable Cardiovascular status: blood pressure returned to baseline and stable Postop Assessment: no apparent nausea or vomiting Anesthetic complications: no   No notable events documented.  Last Vitals:  Vitals:   06/14/22 1233 06/14/22 1254  BP: 103/75 107/77  Pulse: 61 (!) 59  Resp:  18  Temp: (!) 36.4 C 36.4 C  SpO2: 95% 100%    Last Pain:  Vitals:   06/14/22 1254  TempSrc: Oral  PainSc: 0-No pain                 Pervis Hocking

## 2022-06-14 NOTE — Transfer of Care (Signed)
Immediate Anesthesia Transfer of Care Note  Patient: Brenda Serrano  Procedure(s) Performed: DECOMPRESSION RIGHT ULNAR NERVE (Right: Arm Upper)  Patient Location: PACU  Anesthesia Type:MAC combined with regional for post-op pain  Level of Consciousness: awake  Airway & Oxygen Therapy: Patient Spontanous Breathing and Patient connected to face mask oxygen  Post-op Assessment: Report given to RN and Post -op Vital signs reviewed and stable  Post vital signs: Reviewed and stable  Last Vitals:  Vitals Value Taken Time  BP    Temp    Pulse 61 06/14/22 1233  Resp    SpO2 95 % 06/14/22 1233  Vitals shown include unvalidated device data.  Last Pain:  Vitals:   06/14/22 1010  TempSrc: Oral  PainSc: 7       Patients Stated Pain Goal: 3 (69/48/54 6270)  Complications: No notable events documented.

## 2022-06-14 NOTE — H&P (Signed)
Brenda Serrano is an 51 y.o. female.   Chief Complaint: ulnar nerve compression HPI: 51 yo female with numbness and tingling right hand.  Positive nerve conduction studies.  She wishes to have right ulnar nerve decompression with possible transposition.  Allergies:  Allergies  Allergen Reactions   Keflex [Cephalexin] Shortness Of Breath   Amoxicillin Swelling    Hives and rash, swelling in face and arms    Gabapentin Other (See Comments)    Edema in lower ext    Nyquil Multi-Symptom [Pseudoeph-Doxylamine-Dm-Apap] Itching    And hives    Trazodone Other (See Comments)    Pt states it was a long time ago, felt like throat was closing    Ciprofloxacin Hives    Past Medical History:  Diagnosis Date   Anxiety    Asthma    Hashimoto's thyroiditis 1998   Headache    Hypothyroidism     Past Surgical History:  Procedure Laterality Date   CARPAL TUNNEL RELEASE Right 06/30/2019   Procedure: CARPAL TUNNEL RELEASE;  Surgeon: [name], MD;  Location: [facility];  Service: Orthopedics;  Laterality: Right;   CARPAL TUNNEL RELEASE Left 01/01/2020   Procedure: LEFT CARPAL TUNNEL RELEASE;  Surgeon: [name], MD;  Location: [facility];  Service: Orthopedics;  Laterality: Left;  IV REGIONAL FOREARM BLOCK   CESAREAN SECTION     STRABISMUS SURGERY     x2   TOE AMPUTATION Right    small toe   TUBAL LIGATION     ULNAR COLLATERAL LIGAMENT REPAIR Right 06/30/2019   Procedure: REPAIR RECONSTRUCTION ULNAR COLLATERAL LIGAMENT RIGHT THUMB;  Surgeon: [name], MD;  Location: [facility];  Service: Orthopedics;  Laterality: Right;  AXILLARY BLOCK   ULNAR NERVE TRANSPOSITION Left 04/26/2020   Procedure: ULNAR NERVE DECOMPRESSION   LEFT ELBOW;  Surgeon: [name], MD;  Location: [facility];  Service: Orthopedics;  Laterality: Left;  AXILLARY BLOCK    Family History: Family History  Problem Relation Age of Onset   Heart  failure Mother    Hypothyroidism Mother    Hypertension Mother    Atrial fibrillation Mother    Heart attack Brother    Heart failure Maternal Grandfather    Stroke Maternal Grandfather     Social History:   reports that she has never smoked. She has never used smokeless tobacco. She reports current alcohol use. She reports that she does not use drugs.  Medications: Medications Prior to Admission  Medication Sig Dispense Refill   ALPRAZolam (XANAX) 1 MG tablet Take 1 mg by mouth 2 (two) times daily as needed for anxiety.     aspirin EC 81 MG tablet Take 81 mg by mouth daily. Swallow whole.     busPIRone (BUSPAR) 10 MG tablet Take 10 mg by mouth 3 (three) times daily.     carisoprodol (SOMA) 350 MG tablet Take 350 mg by mouth 3 (three) times daily as needed for muscle spasms.     fluticasone-salmeterol (ADVAIR) 100-50 MCG/ACT AEPB Inhale 1 puff into the lungs 2 (two) times daily.     levothyroxine (SYNTHROID) 150 MCG tablet Take 150 mcg by mouth daily before breakfast.     topiramate (TOPAMAX) 50 MG tablet Take 50 mg by mouth 2 (two) times daily.     acetaminophen (TYLENOL) 500 MG tablet Take 500 mg by mouth every 6 (six) hours as needed for headache or moderate pain.     albuterol (VENTOLIN HFA) 108 (  90 Base) MCG/ACT inhaler Inhale into the lungs every 6 (six) hours as needed for wheezing or shortness of breath.      No results found for this or any previous visit (from the past 48 hour(s)).  No results found.    Height 5\' 2"  (1.575 m), weight 78.5 kg.  General appearance: alert, cooperative, and appears stated age Head: Normocephalic, without obvious abnormality, atraumatic Neck: supple, symmetrical, trachea midline Extremities: Intact sensation and capillary refill all digits except right ring and small fingers with decreased sensation.  +epl/fpl/io.  No wounds.  Pulses: 2+ and symmetric Skin: Skin color, texture, turgor normal. 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  Plan decompression with possible transposition ulnar nerve.  Risks, benefits and alternatives of surgery were discussed including risks of blood loss, infection, damage to nerves/vessels/tendons/ligament/bone, failure of surgery, need for additional surgery, complication with wound healing, stiffness.  She voiced understanding of these risks and elected to proceed.    06/14/2022, 9:53 AM

## 2022-06-14 NOTE — Op Note (Signed)
I assisted Surgeon(s) and Role:    * Betha Loa, MD - Primary    * [name], MD - Assisting on the Procedure(s): DECOMPRESSION RIGHT ULNAR NERVE on 06/14/2022.  I provided assistance on this case as follows:, Set up, approach, positioning the arm, retraction for visualization of the nerve, placement and retraction for decompression of the nerve distally followed by retraction proximally, decompression of the nerve, lection extension to be certain of no subluxation, closure of the wound and application of the dressing.  Electronically signed by: [name], MD Date: 06/14/2022 Time: 12:29 PM

## 2022-06-14 NOTE — Anesthesia Preprocedure Evaluation (Addendum)
Anesthesia Evaluation  Patient identified by MRN, date of birth, ID band Patient awake    Reviewed: Allergy & Precautions, NPO status , Patient's Chart, lab work & pertinent test results  Airway Mallampati: I  TM Distance: >3 FB Neck ROM: Full    Dental  (+) Teeth Intact, Dental Advisory Given   Pulmonary asthma ,    Pulmonary exam normal breath sounds clear to auscultation       Cardiovascular negative cardio ROS Normal cardiovascular exam Rhythm:Regular Rate:Normal     Neuro/Psych  Headaches, PSYCHIATRIC DISORDERS Anxiety    GI/Hepatic negative GI ROS, Neg liver ROS,   Endo/Other  Hypothyroidism   Renal/GU negative Renal ROS  negative genitourinary   Musculoskeletal negative musculoskeletal ROS (+)   Abdominal   Peds  Hematology negative hematology ROS (+)   Anesthesia Other Findings   Reproductive/Obstetrics negative OB ROS                            Anesthesia Physical Anesthesia Plan  ASA: 2  Anesthesia Plan: MAC and Regional   Post-op Pain Management: Regional block* and Tylenol PO (pre-op)*   Induction:   PONV Risk Score and Plan: 2 and Propofol infusion and TIVA  Airway Management Planned: Natural Airway and Simple Face Mask  Additional Equipment: None  Intra-op Plan:   Post-operative Plan:   Informed Consent: I have reviewed the patients History and Physical, chart, labs and discussed the procedure including the risks, benefits and alternatives for the proposed anesthesia with the patient or authorized representative who has indicated his/her understanding and acceptance.     Dental advisory given  Plan Discussed with: CRNA  Anesthesia Plan Comments:        Anesthesia Quick Evaluation

## 2022-06-14 NOTE — Anesthesia Procedure Notes (Signed)
Anesthesia Regional Block: Supraclavicular block   Pre-Anesthetic Checklist: , timeout performed,  Correct Patient, Correct Site, Correct Laterality,  Correct Procedure, Correct Position, site marked,  Risks and benefits discussed,  Surgical consent,  Pre-op evaluation,  At surgeon's request and post-op pain management  Laterality: Right  Prep: Maximum Sterile Barrier Precautions used, chloraprep       Needles:  Injection technique: Single-shot  Needle Type: Echogenic Stimulator Needle     Needle Length: 9cm  Needle Gauge: 22     Additional Needles:   Procedures:,,,, ultrasound used (permanent image in chart),,    Narrative:  Start time: 06/14/2022 10:30 AM End time: 06/14/2022 10:35 AM Injection made incrementally with aspirations every 5 mL.  Performed by: Personally  Anesthesiologist: Lannie Fields, DO  Additional Notes: Monitors applied. No increased pain on injection. No increased resistance to injection. Injection made in 5cc increments. Good needle visualization. Patient tolerated procedure well.

## 2022-06-14 NOTE — Discharge Instructions (Addendum)
No tylenol until 6:30 pm. Hand Center Instructions Hand Surgery  Wound Care: Keep your hand elevated above the level of your heart.  Do not allow it to dangle by your side.  Keep the dressing dry and do not remove it unless your doctor advises you to do so.  He will usually change it at the time of your post-op visit.  Moving your fingers is advised to stimulate circulation but will depend on the site of your surgery.  If you have a splint applied, your doctor will advise you regarding movement.  Activity: Do not drive or operate machinery today.  Rest today and then you may return to your normal activity and work as indicated by your physician.  Diet:  Drink liquids today or eat a light diet.  You may resume a regular diet tomorrow.    General expectations: Pain for two to three days. Fingers may become slightly swollen.  Call your doctor if any of the following occur: Severe pain not relieved by pain medication. Elevated temperature. Dressing soaked with blood. Inability to move fingers. White or bluish color to fingers. Regional Anesthesia Blocks  1. Numbness or the inability to move the "blocked" extremity may last from 3-48 hours after placement. The length of time depends on the medication injected and your individual response to the medication. If the numbness is not going away after 48 hours, call your surgeon.  2. The extremity that is blocked will need to be protected until the numbness is gone and the  Strength has returned. Because you cannot feel it, you will need to take extra care to avoid injury. Because it may be weak, you may have difficulty moving it or using it. You may not know what position it is in without looking at it while the block is in effect.  3. For blocks in the legs and feet, returning to weight bearing and walking needs to be done carefully. You will need to wait until the numbness is entirely gone and the strength has returned. You should be able to  move your leg and foot normally before you try and bear weight or walk. You will need someone to be with you when you first try to ensure you do not fall and possibly risk injury.  4. Bruising and tenderness at the needle site are common side effects and will resolve in a few days.  5. Persistent numbness or new problems with movement should be communicated to the surgeon or the [facility] [phone] [facility] [phone]).  Post Anesthesia Home Care Instructions  Activity: Get plenty of rest for the remainder of the day. A responsible individual must stay with you for 24 hours following the procedure.  For the next 24 hours, DO NOT: -Drive a car -Advertising copywriter -Drink alcoholic beverages -Take any medication unless instructed by your physician -Make any legal decisions or sign important papers.  Meals: Start with liquid foods such as gelatin or soup. Progress to regular foods as tolerated. Avoid greasy, spicy, heavy foods. If nausea and/or vomiting occur, drink only clear liquids until the nausea and/or vomiting subsides. Call your physician if vomiting continues.  Special Instructions/Symptoms: Your throat may feel dry or sore from the anesthesia or the breathing tube placed in your throat during surgery. If this causes discomfort, gargle with warm salt water. The discomfort should disappear within 24 hours.  If you had a scopolamine patch placed behind your ear for the management of post- operative  nausea and/or vomiting:  1. The medication in the patch is effective for 72 hours, after which it should be removed.  Wrap patch in a tissue and discard in the trash. Wash hands thoroughly with soap and water. 2. You may remove the patch earlier than 72 hours if you experience unpleasant side effects which may include dry mouth, dizziness or visual disturbances. 3. Avoid touching the patch. Wash your hands with soap and water after contact with the  patch.

## 2022-06-14 NOTE — Op Note (Signed)
NAME: Brenda Serrano [facility] MEDICAL RECORD NO: [id number] DATE OF BIRTH: 1971/01/04 FACILITY: [facility] LOCATION: [facility] PHYSICIAN: Tami Ribas, MD   OPERATIVE REPORT   DATE OF PROCEDURE: 06/14/22    PREOPERATIVE DIAGNOSIS: Right ulnar nerve compression at the elbow   POSTOPERATIVE DIAGNOSIS: Right ulnar nerve compression at the elbow   PROCEDURE: Right ulnar nerve decompression at elbow   SURGEON:  Betha Loa, M.D.   ASSISTANT: [name], MD   ANESTHESIA:  Regional with sedation   INTRAVENOUS FLUIDS:  Per anesthesia flow sheet.   ESTIMATED BLOOD LOSS:  Minimal.   COMPLICATIONS:  None.   SPECIMENS:  none   TOURNIQUET TIME:    Total Tourniquet Time Documented: Upper Arm (Right) - 22 minutes Total: Upper Arm (Right) - 22 minutes    DISPOSITION:  Stable to PACU.   INDICATIONS: 51 year old female with numbness and tingling of the right ring and small fingers.  Positive nerve conduction studies.  She has had previous left ulnar nerve decompression wishes to have right ulnar nerve decompression for management of symptoms.  Risks, benefits and alternatives of surgery were discussed including the risks of blood loss, infection, damage to nerves, vessels, tendons, ligaments, bone for surgery, need for additional surgery, complications with wound healing, continued pain, stiffness, , recurrence.  She voiced understanding of these risks and elected to proceed.  OPERATIVE COURSE:  After being identified preoperatively by myself,  the patient and I agreed on the procedure and site of the procedure.  The surgical site was marked.  Surgical consent had been signed. Preoperative IV antibiotic prophylaxis was given. She was transferred to the operating room and placed on the operating table in supine position with the Right upper extremity on an arm board.  Sedation was induced by the anesthesiologist. A regional block had been performed by anesthesia in preoperative  holding.    Right upper extremity was prepped and draped in normal sterile orthopedic fashion.  A surgical pause was performed between the surgeons, anesthesia, and operating room staff and all were in agreement as to the patient, procedure, and site of procedure.  Tourniquet at the proximal aspect of the extremity was inflated to 250 mmHg after exsanguination of the arm with an Esmarch bandage.  Incision was made at the medial side of the elbow and carried in subcutaneous tissues by spreading technique.  Bipolar electrocautery is used to obtain hemostasis.  The ulnar nerve was identified proximal to Osborne's ligament.  It was carefully protected and Osborne's ligament sharply incised under direct visualization.  The nerve was decompressed distally first.  The muscular fascia over the FCU muscle was released the muscle bellies of the FCU were spread.  The nerve branch to the muscle was protected.  The investing fascia over the nerve was released under direct visualization.  The nerve was then decompressed proximally.  Again the muscular fascia was released.  The muscle was freed up from over the nerve and the investing fascia over the nerve released.  There was a large triceps that came around posteriorly to the nerve.  The elbow was placed into flexion and the nerve did not subluxate.  The wound was copiously irrigated with sterile saline.  The volar leaflet of Osborne's ligament was repaired to the posterior subcutaneous tissues with 2-0 Vicryl suture to provide a bolster.  There was no compression created over the nerve.  Inverted interrupted 4-0 Vicryl suture was placed in the subcutaneous tissues and skin was closed with 4-0 nylon  in a horizontal mattress fashion.  The wound was dressed with sterile Xeroform 4 x 4's and ABD and wrapped with Kerlix and Ace bandage.  The tourniquet was deflated at 22 minutes.  Fingertips were pink with brisk capillary refill after deflation of tourniquet.  The operative  drapes  were broken down.  The patient was awoken from anesthesia safely.  She was transferred back to the stretcher and taken to PACU in stable condition.  I will see her back in the office in 1 week for postoperative followup.  I will give her a prescription for Norco 5/325 1-2 tabs PO q6 hours prn pain, dispense # 15.   Betha Loa, MD Electronically signed, 06/14/22

## 2022-06-14 NOTE — Progress Notes (Signed)
Assisted Dr. Finucane with right, supraclavicular, ultrasound guided block. Side rails up, monitors on throughout procedure. See vital signs in flow sheet. Tolerated Procedure well. 

## 2022-06-15 ENCOUNTER — Encounter (HOSPITAL_BASED_OUTPATIENT_CLINIC_OR_DEPARTMENT_OTHER): Payer: Self-pay | Admitting: Orthopedic Surgery

## 2022-08-13 IMAGING — DX DG CHEST 2V
2 series · 2 of 2 positions shown · non-contrast
Comparison: 01/10/2021

CLINICAL DATA: Chest pain

EXAM:
CHEST - 2 VIEW

[chest lat]
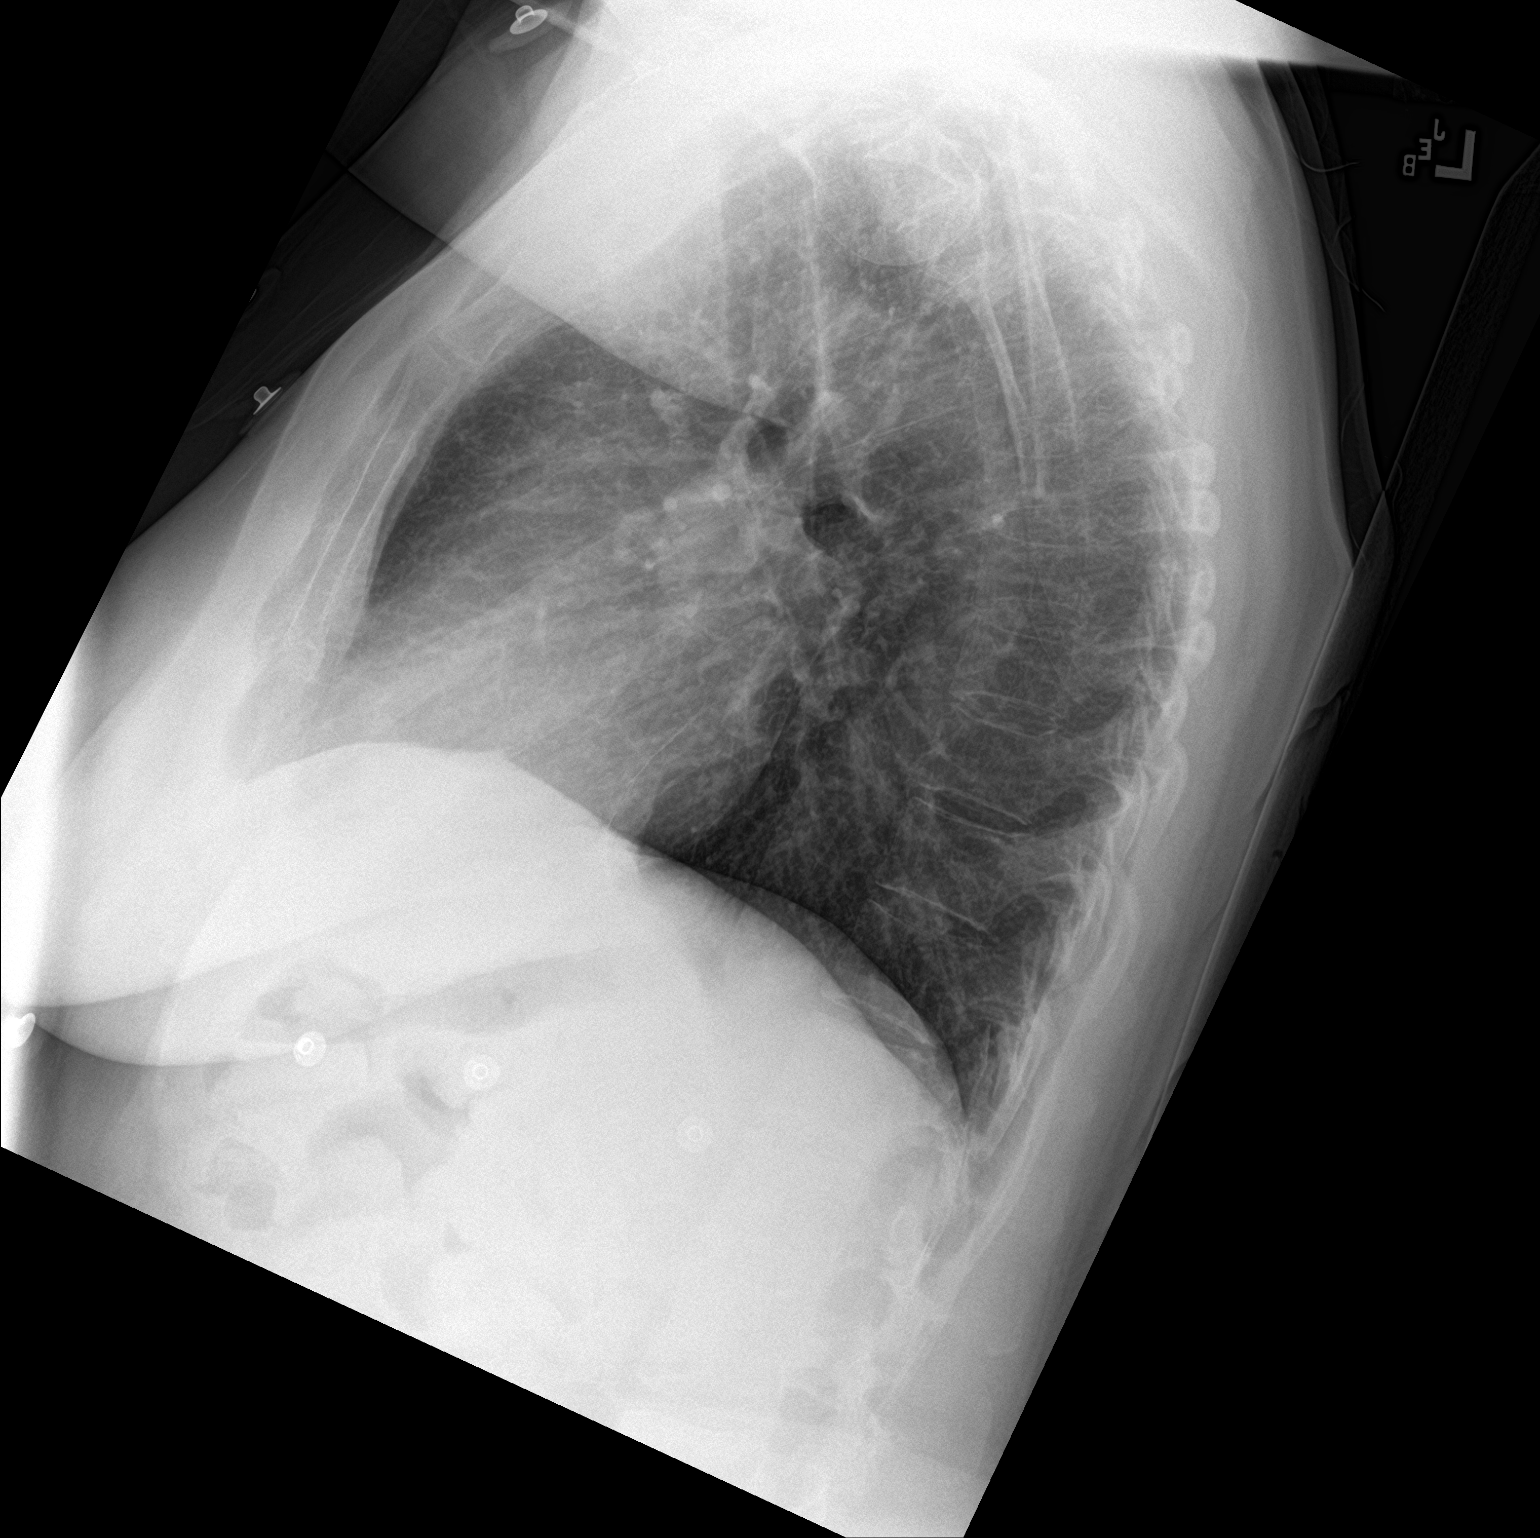

[chest ap]
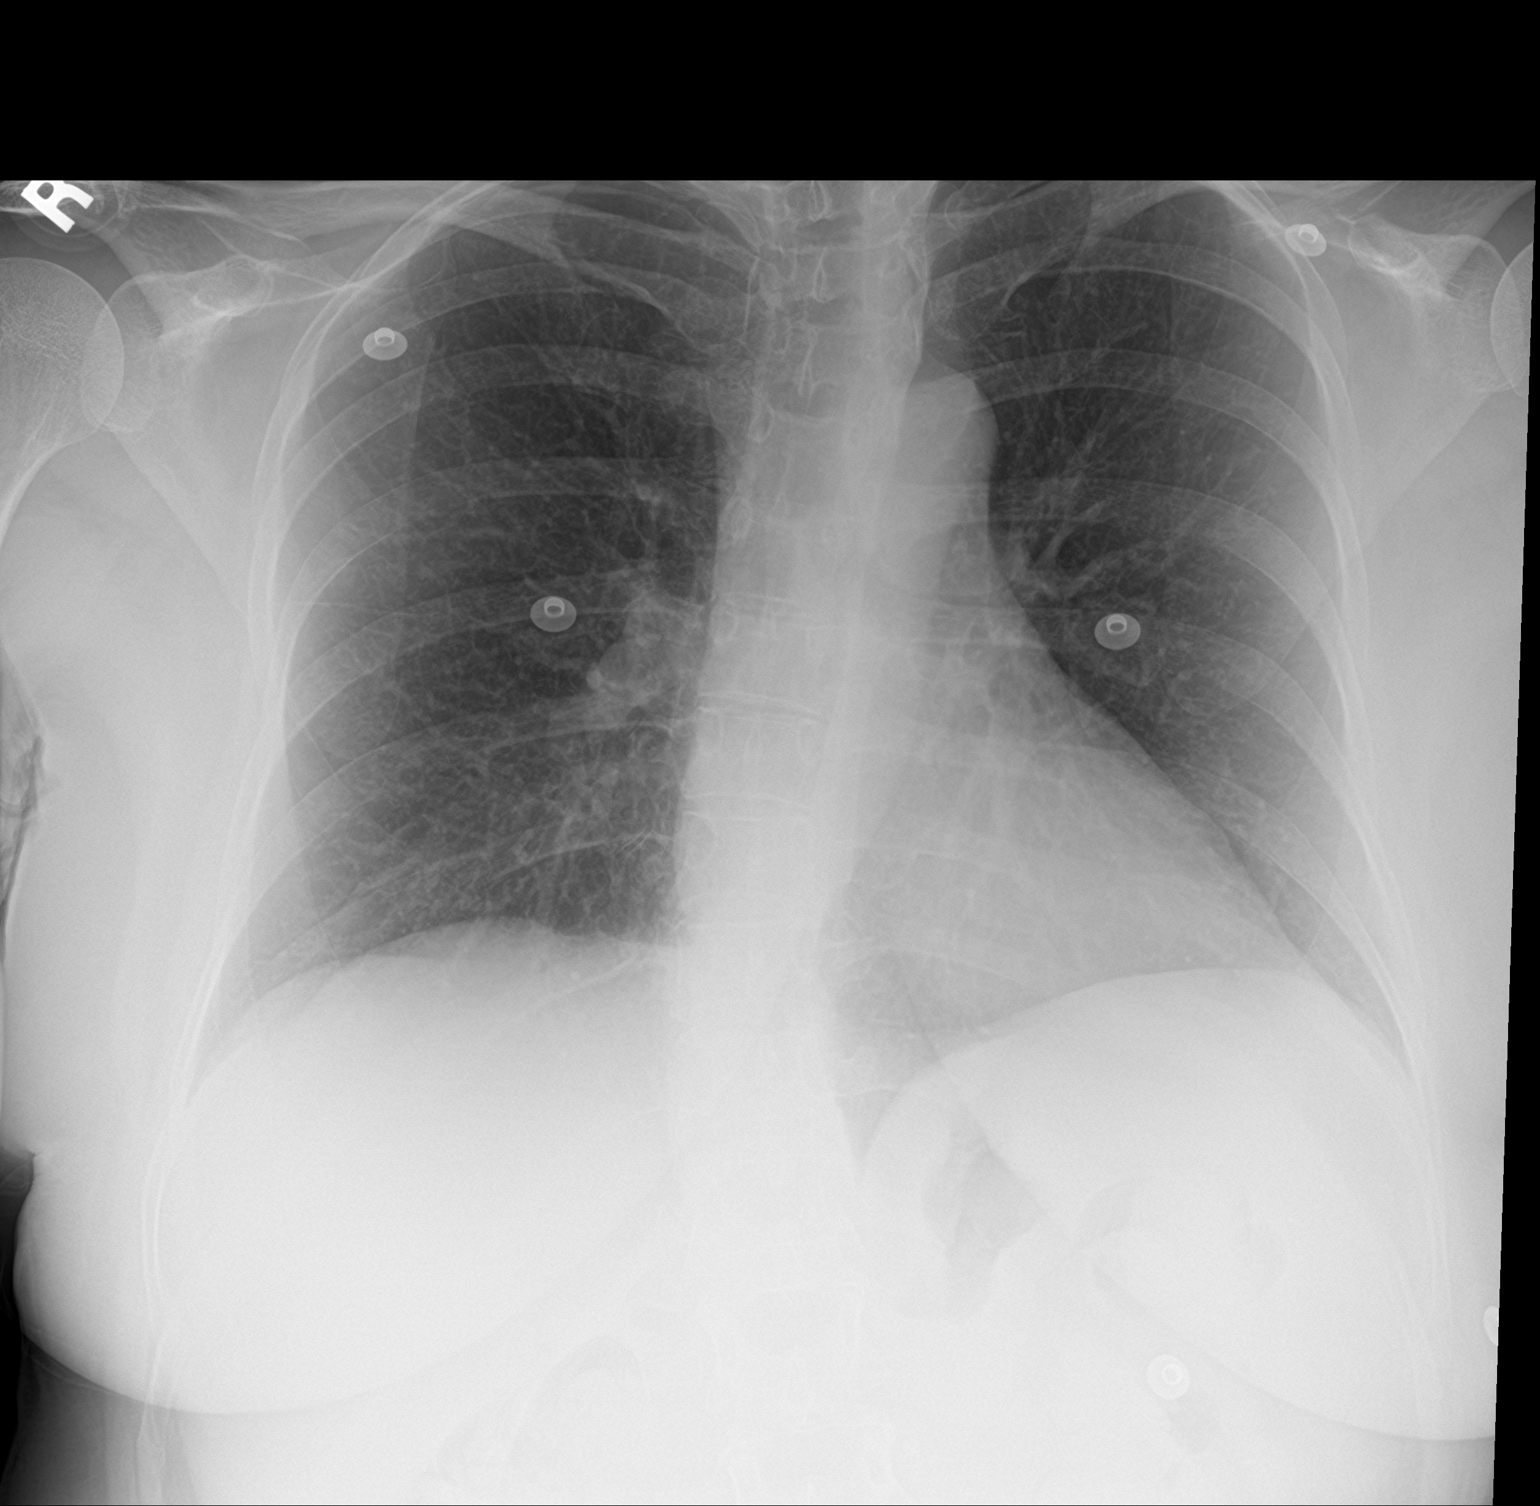

[2 of 2 positions shown; findings below may reference images not displayed]

FINDINGS: The heart size and mediastinal contours are within normal limits.
Both lungs are clear. No pleural effusion or pneumothorax. The
visualized skeletal structures are unremarkable.
IMPRESSION: No acute process in the chest.

## 2022-09-10 ENCOUNTER — Ambulatory Visit
Admission: EM | Admit: 2022-09-10 | Discharge: 2022-09-10 | Disposition: A | Discharge status: Home / Self Care | Payer: Medicare Other

## 2022-09-10 ENCOUNTER — Encounter: Payer: Self-pay | Admitting: Emergency Medicine

## 2022-09-10 DIAGNOSIS — H6593 Unspecified nonsuppurative otitis media, bilateral: Secondary | ICD-10-CM | POA: Diagnosis not present

## 2022-09-10 DIAGNOSIS — R519 Headache, unspecified: Secondary | ICD-10-CM | POA: Diagnosis not present

## 2022-09-10 DIAGNOSIS — R051 Acute cough: Secondary | ICD-10-CM

## 2022-09-10 MED ORDER — DOXYCYCLINE HYCLATE 100 MG PO CAPS: 100.0000 mg | ORAL_CAPSULE | Freq: Two times a day (BID) | ORAL | 0 refills | Status: AC

## 2022-09-10 MED ORDER — PREDNISONE 50 MG PO TABS: 50.0000 mg | ORAL_TABLET | Freq: Every day | ORAL | 0 refills | Status: AC

## 2022-09-10 MED ORDER — FLUTICASONE PROPIONATE 50 MCG/ACT NA SUSP: 1.0000 | Freq: Two times a day (BID) | NASAL | 0 refills | Status: AC

## 2022-09-10 NOTE — ED Provider Notes (Signed)
[facility]    CSN: [id number] Arrival date & time: 09/10/22  1325      History   Chief Complaint Chief Complaint  Patient presents with   Otalgia    HPI Brenda Serrano is a 51 y.o. female.   Pleasant 51 year old female with a known history of allergies and asthma presents today due to concern of chest congestion, headache, nasal congestion, and bilateral ear pain.  She states all of her symptoms started over a week ago.  She feels that cigarette smoke exposure exacerbates all of her symptoms.  She personally does not smoke.  She travels back and forth between here in [place]. Has taken OTC meds without relief.    Otalgia   Past Medical History:  Diagnosis Date   Anxiety    Asthma    Hashimoto's thyroiditis 1998   Headache    Hypothyroidism     There are no problems to display for this patient.   Past Surgical History:  Procedure Laterality Date   ANKLE SURGERY Right 12/2020   CARPAL TUNNEL RELEASE Right 06/30/2019   Procedure: CARPAL TUNNEL RELEASE;  Surgeon: Daryll Brod, MD;  Location: [place];  Service: Orthopedics;  Laterality: Right;   CARPAL TUNNEL RELEASE Left 01/01/2020   Procedure: LEFT CARPAL TUNNEL RELEASE;  Surgeon: Daryll Brod, MD;  Location: [place];  Service: Orthopedics;  Laterality: Left;  IV REGIONAL FOREARM BLOCK   CESAREAN SECTION     STRABISMUS SURGERY     x2   TOE AMPUTATION Right    small toe   TUBAL LIGATION     ULNAR COLLATERAL LIGAMENT REPAIR Right 06/30/2019   Procedure: REPAIR RECONSTRUCTION ULNAR COLLATERAL LIGAMENT RIGHT THUMB;  Surgeon: Daryll Brod, MD;  Location: [place];  Service: Orthopedics;  Laterality: Right;  AXILLARY BLOCK   ULNAR NERVE TRANSPOSITION Left 04/26/2020   Procedure: ULNAR NERVE DECOMPRESSION   LEFT ELBOW;  Surgeon: Daryll Brod, MD;  Location: [place];  Service: Orthopedics;  Laterality: Left;  AXILLARY BLOCK   ULNAR  NERVE TRANSPOSITION Right 06/14/2022   Procedure: DECOMPRESSION RIGHT ULNAR NERVE;  Surgeon: Leanora Cover, MD;  Location: [place];  Service: Orthopedics;  Laterality: Right;    OB History   No obstetric history on file.      Home Medications    Prior to Admission medications   Medication Sig Start Date End Date Taking? Authorizing Provider  doxycycline (VIBRAMYCIN) 100 MG capsule Take 1 capsule (100 mg total) by mouth 2 (two) times daily. 09/10/22  Yes Sayla Golonka L, PA  fluticasone (FLONASE) 50 MCG/ACT nasal spray Place 1 spray into both nostrils in the morning and at bedtime. 09/10/22  Yes Ziyonna Christner L, PA  predniSONE (DELTASONE) 50 MG tablet Take 1 tablet (50 mg total) by mouth daily with breakfast for 3 days. 09/10/22 09/13/22 Yes Simonne Boulos L, PA  acetaminophen (TYLENOL) 500 MG tablet Take 500 mg by mouth every 6 (six) hours as needed for headache or moderate pain.    [provider]  albuterol (VENTOLIN HFA) 108 (90 Base) MCG/ACT inhaler Inhale into the lungs every 6 (six) hours as needed for wheezing or shortness of breath.    [provider]  ALPRAZolam Duanne Moron) 1 MG tablet Take 1 mg by mouth 2 (two) times daily as needed for anxiety.    Joline Salt, RN  aspirin EC 81 MG tablet Take 81 mg by mouth daily. Swallow whole.  [provider]  busPIRone (BUSPAR) 10 MG tablet Take 10 mg by mouth 3 (three) times daily.    [provider]  carisoprodol (SOMA) 350 MG tablet Take 350 mg by mouth 3 (three) times daily as needed for muscle spasms. 06/16/19   [provider]  fluticasone-salmeterol (ADVAIR) 100-50 MCG/ACT AEPB Inhale 1 puff into the lungs 2 (two) times daily.    [provider]  HYDROcodone-acetaminophen (NORCO/VICODIN) 5-325 MG tablet 1-2 tabs PO q6 hours prn pain 06/14/22   Betha Loa, MD  levonorgestrel (MIRENA) 20 MCG/DAY IUD by Intrauterine route.    [provider]  levothyroxine  (SYNTHROID) 150 MCG tablet Take 150 mcg by mouth daily before breakfast.    [provider]  topiramate (TOPAMAX) 50 MG tablet Take 50 mg by mouth 2 (two) times daily.    [provider]    Family History Family History  Problem Relation Age of Onset   Heart failure Mother    Hypothyroidism Mother    Hypertension Mother    Atrial fibrillation Mother    Heart attack Brother    Heart failure Maternal Grandfather    Stroke Maternal Grandfather     Social History Social History   Tobacco Use   Smoking status: Never   Smokeless tobacco: Never  Vaping Use   Vaping Use: Never used  Substance Use Topics   Alcohol use: Yes    Comment: social   Drug use: Never     Allergies   Keflex [cephalexin], Amoxicillin, Gabapentin, Nyquil multi-symptom [pseudoeph-doxylamine-dm-apap], Trazodone, and Ciprofloxacin   Review of Systems Review of Systems  HENT:  Positive for ear pain.   As per HPI   Physical Exam Triage Vital Signs ED Triage Vitals  Enc Vitals Group     BP 09/10/22 1419 128/87     Pulse Rate 09/10/22 1419 86     Resp 09/10/22 1419 16     Temp 09/10/22 1419 98.8 F (37.1 C)     Temp Source 09/10/22 1419 Oral     SpO2 09/10/22 1419 99 %     Weight 09/10/22 1420 168 lb (76.2 kg)     Height --      Head Circumference --      Peak Flow --      Pain Score 09/10/22 1420 4     Pain Loc --      Pain Edu? --      Excl. in GC? --    No data found.  Updated Vital Signs BP 128/87 (BP Location: Right Arm)   Pulse 86   Temp 98.8 F (37.1 C) (Oral)   Resp 16   Wt 168 lb (76.2 kg)   SpO2 99%   BMI 30.73 kg/m   Visual Acuity Right Eye Distance:   Left Eye Distance:   Bilateral Distance:    Right Eye Near:   Left Eye Near:    Bilateral Near:     Physical Exam Vitals and nursing note reviewed.  Constitutional:      Appearance: She is well-developed and normal weight. She is not ill-appearing, toxic-appearing or diaphoretic.  HENT:     Head:  Normocephalic and atraumatic.     Right Ear: Ear canal and external ear normal. No drainage, swelling or tenderness. No middle ear effusion. Tympanic membrane is not erythematous.     Left Ear: Ear canal and external ear normal. No drainage, swelling or tenderness.  No middle ear effusion. Tympanic membrane is not erythematous.  Ears:     Comments: Bilateral middle ear effusions, amber fluid, R>L, moderately erythematous TMS without bulging    Nose: Congestion and rhinorrhea present.     Mouth/Throat:     Mouth: Mucous membranes are moist. No oral lesions.     Pharynx: Oropharynx is clear. No pharyngeal swelling, oropharyngeal exudate, posterior oropharyngeal erythema or uvula swelling.     Tonsils: No tonsillar exudate or tonsillar abscesses.  Eyes:     Extraocular Movements:     Right eye: Normal extraocular motion.     Left eye: Normal extraocular motion.     Conjunctiva/sclera: Conjunctivae normal.     Pupils: Pupils are equal, round, and reactive to light.  Neck:     Thyroid: No thyromegaly.  Cardiovascular:     Rate and Rhythm: Normal rate.     Heart sounds: Normal heart sounds. No murmur heard. Pulmonary:     Effort: Pulmonary effort is normal. No respiratory distress.     Breath sounds: Normal breath sounds. No stridor. No wheezing, rhonchi or rales.  Chest:     Chest wall: No tenderness.  Abdominal:     General: Bowel sounds are normal. There is no distension.     Palpations: Abdomen is soft. There is no mass.     Tenderness: There is no abdominal tenderness. There is no guarding or rebound.     Hernia: No hernia is present.  Musculoskeletal:     Cervical back: Normal range of motion and neck supple.  Lymphadenopathy:     Cervical: No cervical adenopathy.  Skin:    General: Skin is warm.     Capillary Refill: Capillary refill takes less than 2 seconds.     Coloration: Skin is not pale.     Findings: No erythema or rash.  Neurological:     General: No focal deficit  present.     Mental Status: She is alert and oriented to person, place, and time.  Psychiatric:        Mood and Affect: Mood normal.        Behavior: Behavior normal.      UC Treatments / Results  Labs (all labs ordered are listed, but only abnormal results are displayed) Labs Reviewed - No data to display  EKG   Radiology No results found.  Procedures Procedures (including critical care time)  Medications Ordered in UC Medications - No data to display  Initial Impression / Assessment and Plan / UC Course  I have reviewed the triage vital signs and the nursing notes.  Pertinent labs & imaging results that were available during my care of the patient were reviewed by me and considered in my medical decision making (see chart for details).     OM with effusion -patient has numerous antibiotic allergies.  Confirm she has taken doxycycline in the past with good results.  We will start this twice daily to help with the infection of her ears.  We will also start prednisone and Flonase to help with the effusion and eustachian tube dysfunction associated with that. Headache -likely secondary to #1.  Treatment as above.  Return to clinic if no relief. Acute cough -secondary to postnasal drainage.  I hear no wheezing upon exam, lungs clear vital signs stable.  Supportive measures discussed.   Final Clinical Impressions(s) / UC Diagnoses   Final diagnoses:  Otitis media with effusion, bilateral  Acute intractable headache, unspecified headache type  Acute cough     Discharge Instructions  Please start taking doxycycline twice daily until gone. Please take 1 tablet of prednisone every morning until gone. Please use 1 spray of Flonase in each nostril twice daily to help with congestion and opening your eustachian tube. Use the steam from a hot shower to also help open up your airway. A microwavable heat pack applied to your chest may also help provide you some  relief.   ED Prescriptions     Medication Sig Dispense Auth. Provider   doxycycline (VIBRAMYCIN) 100 MG capsule Take 1 capsule (100 mg total) by mouth 2 (two) times daily. 20 capsule Starlit Raburn L, PA   predniSONE (DELTASONE) 50 MG tablet Take 1 tablet (50 mg total) by mouth daily with breakfast for 3 days. 3 tablet Bynum Mccullars L, PA   fluticasone (FLONASE) 50 MCG/ACT nasal spray Place 1 spray into both nostrils in the morning and at bedtime. 16 mL Ottilie Wigglesworth L, PA      PDMP not reviewed this encounter.   Chaney Malling, [place] 09/10/22 [phone]

## 2022-09-10 NOTE — ED Triage Notes (Signed)
Pt states for over 1 week she has had bilateral ear pain, cough and congestion that is not improving with OTC cold and allergy meds. States she has bad allergies but this feels worse than usual.

## 2022-09-10 NOTE — Discharge Instructions (Addendum)
Please start taking doxycycline twice daily until gone. Please take 1 tablet of prednisone every morning until gone. Please use 1 spray of Flonase in each nostril twice daily to help with congestion and opening your eustachian tube. Use the steam from a hot shower to also help open up your airway. A microwavable heat pack applied to your chest may also help provide you some relief.

## 2022-09-17 ENCOUNTER — Ambulatory Visit (INDEPENDENT_AMBULATORY_CARE_PROVIDER_SITE_OTHER): Payer: Medicare Other

## 2022-09-17 ENCOUNTER — Ambulatory Visit
Admission: RE | Admit: 2022-09-17 | Discharge: 2022-09-17 | Disposition: A | Discharge status: Home / Self Care | Payer: Medicare Other | Source: Ambulatory Visit | Attending: Family Medicine | Admitting: Family Medicine

## 2022-09-17 VITALS — BP 131/88 | HR 77 | Temp 99.5°F | Resp 18 | Ht 62.0 in | Wt 168.0 lb

## 2022-09-17 DIAGNOSIS — R059 Cough, unspecified: Secondary | ICD-10-CM | POA: Diagnosis not present

## 2022-09-17 DIAGNOSIS — U071 COVID-19: Secondary | ICD-10-CM | POA: Diagnosis not present

## 2022-09-17 MED ORDER — BENZONATATE 200 MG PO CAPS: 200.0000 mg | ORAL_CAPSULE | Freq: Three times a day (TID) | ORAL | 0 refills | Status: AC | PRN

## 2022-09-17 MED ORDER — HYDROCOD POLI-CHLORPHE POLI ER 10-8 MG/5ML PO SUER
5.0000 mL | Freq: Two times a day (BID) | ORAL | 0 refills | Status: AC | PRN
Start: 2022-09-17 — End: 2022-11-26

## 2022-09-17 MED ORDER — MOLNUPIRAVIR EUA 200MG CAPSULE: 4.0000 | ORAL_CAPSULE | Freq: Two times a day (BID) | ORAL | 0 refills | Status: AC

## 2022-09-17 NOTE — ED Provider Notes (Signed)
[facility]    CSN: [id number] Arrival date & time: 09/17/22  1249      History   Chief Complaint Chief Complaint  Patient presents with   Follow-up    Saw PA Amy and was diagnosed w URI fluid in ears, headaches & given doxycycline & 3 days of prednisone with nasal spray. By Thursday I was worse & Friday took home test for Covid & tested positive. not feeling better & want FU for further evaluation - Entered by patient   Appointment    HPI Brenda Serrano is a 51 y.o. female.   HPI 51 year old female presents for continued cough, body aches, fatigue, and bad headaches.  Patient is currently taking Tylenol and doxycycline.  Patient was evaluated here on 09/10/2022 for bilateral otitis media with effusion, acute intractable headache, and acute cough.  PMH significant for anxiety, Hashimoto's thyroiditis and headache.  Past Medical History:  Diagnosis Date   Anxiety    Asthma    Hashimoto's thyroiditis 1998   Headache    Hypothyroidism     There are no problems to display for this patient.   Past Surgical History:  Procedure Laterality Date   ANKLE SURGERY Right 12/2020   CARPAL TUNNEL RELEASE Right 06/30/2019   Procedure: CARPAL TUNNEL RELEASE;  Surgeon: [name], MD;  Location: [facility];  Service: Orthopedics;  Laterality: Right;   CARPAL TUNNEL RELEASE Left 01/01/2020   Procedure: LEFT CARPAL TUNNEL RELEASE;  Surgeon: [name], MD;  Location: [facility];  Service: Orthopedics;  Laterality: Left;  IV REGIONAL FOREARM BLOCK   CESAREAN SECTION     STRABISMUS SURGERY     x2   TOE AMPUTATION Right    small toe   TUBAL LIGATION     ULNAR COLLATERAL LIGAMENT REPAIR Right 06/30/2019   Procedure: REPAIR RECONSTRUCTION ULNAR COLLATERAL LIGAMENT RIGHT THUMB;  Surgeon: [name], MD;  Location: [facility];  Service: Orthopedics;  Laterality: Right;  AXILLARY BLOCK   ULNAR NERVE TRANSPOSITION Left  04/26/2020   Procedure: ULNAR NERVE DECOMPRESSION   LEFT ELBOW;  Surgeon: [name], MD;  Location: [facility];  Service: Orthopedics;  Laterality: Left;  AXILLARY BLOCK   ULNAR NERVE TRANSPOSITION Right 06/14/2022   Procedure: DECOMPRESSION RIGHT ULNAR NERVE;  Surgeon: Betha Loa, MD;  Location: [facility];  Service: Orthopedics;  Laterality: Right;    OB History   No obstetric history on file.      Home Medications    Prior to Admission medications   Medication Sig Start Date End Date Taking? Authorizing Provider  acetaminophen (TYLENOL) 500 MG tablet Take 500 mg by mouth every 6 (six) hours as needed for headache or moderate pain.   Yes [provider]  albuterol (VENTOLIN HFA) 108 (90 Base) MCG/ACT inhaler Inhale into the lungs every 6 (six) hours as needed for wheezing or shortness of breath.   Yes [provider]  ALPRAZolam Prudy Feeler) 1 MG tablet Take 1 mg by mouth 2 (two) times daily as needed for anxiety.   Yes Alver Fisher, RN  aspirin EC 81 MG tablet Take 81 mg by mouth daily. Swallow whole.   Yes [provider]  benzonatate (TESSALON) 200 MG capsule Take 1 capsule (200 mg total) by mouth 3 (three) times daily as needed for up to 7 days. 09/17/22 09/24/22 Yes Trevor Iha, FNP  busPIRone (BUSPAR) 10 MG tablet Take 10 mg by mouth 3 (three) times  daily.   Yes [provider]  carisoprodol (SOMA) 350 MG tablet Take 350 mg by mouth 3 (three) times daily as needed for muscle spasms. 06/16/19  Yes [provider]  chlorpheniramine-HYDROcodone (TUSSIONEX) 10-8 MG/5ML Take 5 mLs by mouth every 12 (twelve) hours as needed for cough. 09/17/22 11/26/22 Yes Trevor Iha, FNP  doxycycline (VIBRAMYCIN) 100 MG capsule Take 1 capsule (100 mg total) by mouth 2 (two) times daily. 09/10/22  Yes Crain, Whitney L, PA  fluticasone (FLONASE) 50 MCG/ACT nasal spray Place 1 spray into both nostrils in the morning and at  bedtime. 09/10/22  Yes Crain, Whitney L, PA  fluticasone-salmeterol (ADVAIR) 100-50 MCG/ACT AEPB Inhale 1 puff into the lungs 2 (two) times daily.   Yes [provider]  HYDROcodone-acetaminophen (NORCO/VICODIN) 5-325 MG tablet 1-2 tabs PO q6 hours prn pain 06/14/22  Yes Betha Loa, MD  levonorgestrel (MIRENA) 20 MCG/DAY IUD by Intrauterine route.   Yes [provider]  levothyroxine (SYNTHROID) 150 MCG tablet Take 150 mcg by mouth daily before breakfast.   Yes [provider]  molnupiravir EUA (LAGEVRIO) 200 mg CAPS capsule Take 4 capsules (800 mg total) by mouth 2 (two) times daily for 5 days. 09/17/22 09/22/22 Yes Trevor Iha, FNP  topiramate (TOPAMAX) 50 MG tablet Take 50 mg by mouth 2 (two) times daily.   Yes [provider]    Family History Family History  Problem Relation Age of Onset   Heart failure Mother    Hypothyroidism Mother    Hypertension Mother    Atrial fibrillation Mother    Heart attack Brother    Heart failure Maternal Grandfather    Stroke Maternal Grandfather     Social History Social History   Tobacco Use   Smoking status: Never   Smokeless tobacco: Never  Vaping Use   Vaping Use: Never used  Substance Use Topics   Alcohol use: Yes    Comment: social   Drug use: Never     Allergies   Keflex [cephalexin], Amoxicillin, Gabapentin, Nyquil multi-symptom [pseudoeph-doxylamine-dm-apap], Trazodone, and Ciprofloxacin   Review of Systems Review of Systems  Constitutional:  Positive for fatigue and fever.  Respiratory:  Positive for cough.   Musculoskeletal:  Positive for arthralgias and myalgias.  All other systems reviewed and are negative.    Physical Exam Triage Vital Signs ED Triage Vitals  Enc Vitals Group     BP 09/17/22 1319 131/88     Pulse Rate 09/17/22 1319 77     Resp 09/17/22 1319 18     Temp 09/17/22 1319 99.5 F (37.5 C)     Temp Source 09/17/22 1319 Oral     SpO2 09/17/22 1319 95 %      Weight 09/17/22 1321 168 lb (76.2 kg)     Height 09/17/22 1321 5\' 2"  (1.575 m)     Head Circumference --      Peak Flow --      Pain Score --      Pain Loc --      Pain Edu? --      Excl. in GC? --    No data found.  Updated Vital Signs BP 131/88 (BP Location: Right Arm)   Pulse 77   Temp 99.5 F (37.5 C) (Oral)   Resp 18   Ht 5\' 2"  (1.575 m)   Wt 168 lb (76.2 kg)   SpO2 95%   BMI 30.73 kg/m       Physical Exam Vitals and nursing  note reviewed.  Constitutional:      Appearance: Normal appearance.  HENT:     Head: Normocephalic and atraumatic.     Right Ear: Tympanic membrane, ear canal and external ear normal.     Left Ear: Tympanic membrane, ear canal and external ear normal.     Mouth/Throat:     Mouth: Mucous membranes are moist.     Pharynx: Oropharynx is clear.  Eyes:     Extraocular Movements: Extraocular movements intact.     Conjunctiva/sclera: Conjunctivae normal.     Pupils: Pupils are equal, round, and reactive to light.  Cardiovascular:     Rate and Rhythm: Normal rate and regular rhythm.     Pulses: Normal pulses.     Heart sounds: Normal heart sounds. No murmur heard. Pulmonary:     Effort: Pulmonary effort is normal.     Breath sounds: Normal breath sounds. No wheezing, rhonchi or rales.     Comments: Infrequent nonproductive cough noted on exam Musculoskeletal:        General: Normal range of motion.     Cervical back: Normal range of motion and neck supple.  Skin:    General: Skin is warm and dry.  Neurological:     General: No focal deficit present.     Mental Status: She is alert and oriented to person, place, and time.      UC Treatments / Results  Labs (all labs ordered are listed, but only abnormal results are displayed) Labs Reviewed - No data to display  EKG   Radiology DG Chest 2 View  Result Date: 09/17/2022 CLINICAL DATA:  Cough EXAM: CHEST - 2 VIEW COMPARISON:  02/05/2022 FINDINGS: The heart size and mediastinal  contours are within normal limits. Both lungs are clear. The visualized skeletal structures are unremarkable. IMPRESSION: No active cardiopulmonary disease. Electronically Signed   By: Duanne Guess D.O.   On: 09/17/2022 15:13    Procedures Procedures (including critical care time)  Medications Ordered in UC Medications - No data to display  Initial Impression / Assessment and Plan / UC Course  I have reviewed the triage vital signs and the nursing notes.  Pertinent labs & imaging results that were available during my care of the patient were reviewed by me and considered in my medical decision making (see chart for details).     MDM: 1.  COVID-19-Rx'd Molnupiravir; 2.  Cough-CXR revealed above, Rx'd Tessalon, Tussionex. Advised patient to take medication as directed with food to completion.  Advised may use Tessalon Perles daily or as needed for cough.  Advised may use Promethazine DM for cough prior to sleep due to sedative effects.  Advised patient not to use Tessalon and promethazine together.  Encouraged to increase daily water intake while taking these medications.  Advised patient if symptoms worsen and/or unresolved please follow-up with PCP or here for further evaluation.  Patient discharged home, hemodynamically stable Final Clinical Impressions(s) / UC Diagnoses   Final diagnoses:  COVID-19  Cough, unspecified type     Discharge Instructions      Advised patient to take medication as directed with food to completion.  Advised may use Tessalon Perles daily or as needed for cough.  Advised may use Tussionex for cough prior to sleep due to sedative effects.  Advised patient not to use Tessalon and Tussionex together.  Encouraged to increase daily water intake while taking these medications.  Advised patient if symptoms worsen and/or unresolved please follow-up with PCP or here  for further evaluation.     ED Prescriptions     Medication Sig Dispense Auth. Provider    benzonatate (TESSALON) 200 MG capsule Take 1 capsule (200 mg total) by mouth 3 (three) times daily as needed for up to 7 days. 40 capsule Eliezer Lofts, FNP   chlorpheniramine-HYDROcodone (TUSSIONEX) 10-8 MG/5ML Take 5 mLs by mouth every 12 (twelve) hours as needed for cough. 70 mL Eliezer Lofts, FNP   molnupiravir EUA (LAGEVRIO) 200 mg CAPS capsule Take 4 capsules (800 mg total) by mouth 2 (two) times daily for 5 days. 40 capsule Eliezer Lofts, FNP      I have reviewed the PDMP during this encounter.   Eliezer Lofts, [place] 09/17/22 1551

## 2022-09-17 NOTE — ED Triage Notes (Signed)
Patient states that she tested positive for COVID from a home test on Friday.  Patient is having cough, body aches, fatigue, bad headaches.  Patient is currently on Doxycycline and Tylenol.

## 2022-09-17 NOTE — Discharge Instructions (Addendum)
Advised patient to take medication as directed with food to completion.  Advised may use Tessalon Perles daily or as needed for cough.  Advised may use Tussionex for cough prior to sleep due to sedative effects.  Advised patient not to use Tessalon and Tussionex together.  Encouraged to increase daily water intake while taking these medications.  Advised patient if symptoms worsen and/or unresolved please follow-up with PCP or here for further evaluation.

## 2023-01-03 ENCOUNTER — Encounter (INDEPENDENT_AMBULATORY_CARE_PROVIDER_SITE_OTHER): Payer: Self-pay | Admitting: *Deleted

## 2023-04-08 ENCOUNTER — Telehealth (INDEPENDENT_AMBULATORY_CARE_PROVIDER_SITE_OTHER): Payer: Self-pay | Admitting: Gastroenterology

## 2023-04-08 NOTE — Telephone Encounter (Signed)
Pt left voicemail wanting to schedule colonoscopy. Pt states she sent in questionnaire and believed she had a colonoscopy scheduled.  Returned call to patient. Advised pt that I do not see questinnaire in chart. Pt states she sent it in February. Also advised pt that I didn't seen anything scheduled. Asked Mindy E and Tammy R if they had anything for this patient and they state they do not. Reprinted out quesstionare from February and resent to pt at address she gave me [id number] [place], [place] [address])

## 2023-06-18 ENCOUNTER — Other Ambulatory Visit: Payer: Self-pay | Admitting: Internal Medicine

## 2023-06-18 DIAGNOSIS — G43009 Migraine without aura, not intractable, without status migrainosus: Secondary | ICD-10-CM

## 2023-06-25 ENCOUNTER — Ambulatory Visit (INDEPENDENT_AMBULATORY_CARE_PROVIDER_SITE_OTHER): Payer: No Typology Code available for payment source

## 2023-06-25 DIAGNOSIS — G43009 Migraine without aura, not intractable, without status migrainosus: Secondary | ICD-10-CM

## 2023-07-09 ENCOUNTER — Encounter (INDEPENDENT_AMBULATORY_CARE_PROVIDER_SITE_OTHER): Payer: Self-pay | Admitting: *Deleted

## 2024-01-07 ENCOUNTER — Other Ambulatory Visit (HOSPITAL_COMMUNITY): Payer: Self-pay | Admitting: Internal Medicine

## 2024-01-07 DIAGNOSIS — R1310 Dysphagia, unspecified: Secondary | ICD-10-CM

## 2024-01-20 ENCOUNTER — Encounter (INDEPENDENT_AMBULATORY_CARE_PROVIDER_SITE_OTHER): Payer: Self-pay

## 2024-01-20 ENCOUNTER — Ambulatory Visit (HOSPITAL_COMMUNITY)
Admission: RE | Admit: 2024-01-20 | Discharge: 2024-01-20 | Disposition: A | Discharge status: Home / Self Care | Payer: No Typology Code available for payment source | Source: Ambulatory Visit | Attending: Internal Medicine | Admitting: Internal Medicine

## 2024-01-20 DIAGNOSIS — R1310 Dysphagia, unspecified: Secondary | ICD-10-CM | POA: Insufficient documentation

## 2024-09-16 ENCOUNTER — Encounter (INDEPENDENT_AMBULATORY_CARE_PROVIDER_SITE_OTHER): Payer: Self-pay | Admitting: Gastroenterology
